# Patient Record
Sex: Female | Born: 1975 | Race: White | Hispanic: No | Marital: Single | Smoking: Never smoker
Health system: Southern US, Community
[De-identification: ages and names within clinical notes are randomized; demographics above are authoritative.]

## PROBLEM LIST (undated history)

## (undated) DIAGNOSIS — J45909 Unspecified asthma, uncomplicated: Secondary | ICD-10-CM

## (undated) DIAGNOSIS — G43909 Migraine, unspecified, not intractable, without status migrainosus: Secondary | ICD-10-CM

## (undated) HISTORY — PX: CHOLECYSTECTOMY: SHX55

---

## 2019-09-25 ENCOUNTER — Emergency Department
Admission: EM | Admit: 2019-09-25 | Discharge: 2019-09-25 | Disposition: A | Discharge status: Home / Self Care | Payer: Self-pay | Attending: Emergency Medicine | Admitting: Emergency Medicine

## 2019-09-25 ENCOUNTER — Other Ambulatory Visit: Payer: Self-pay

## 2019-09-25 DIAGNOSIS — T7840XA Allergy, unspecified, initial encounter: Secondary | ICD-10-CM | POA: Insufficient documentation

## 2019-09-25 DIAGNOSIS — J45909 Unspecified asthma, uncomplicated: Secondary | ICD-10-CM | POA: Insufficient documentation

## 2019-09-25 HISTORY — DX: Unspecified asthma, uncomplicated: J45.909

## 2019-09-25 MED ORDER — DIPHENHYDRAMINE HCL 25 MG PO TABS: 50.0000 mg | ORAL_TABLET | Freq: Four times a day (QID) | ORAL | 0 refills | Status: DC

## 2019-09-25 MED ORDER — EPINEPHRINE 0.3 MG/0.3ML IJ SOAJ: 0.3000 mg | Freq: Once | INTRAMUSCULAR | 0 refills | Status: AC

## 2019-09-25 MED ORDER — PREDNISONE 20 MG PO TABS: 40.0000 mg | ORAL_TABLET | Freq: Every day | ORAL | 0 refills | Status: AC

## 2019-09-25 MED ORDER — PREDNISONE 20 MG PO TABS
60.0000 mg | ORAL_TABLET | Freq: Once | ORAL | Status: AC
  Administered 2019-09-25: 60 mg via ORAL
  Filled 2019-09-25: qty 3

## 2019-09-25 MED ORDER — FAMOTIDINE 20 MG PO TABS
40.0000 mg | ORAL_TABLET | Freq: Once | ORAL | Status: AC
  Administered 2019-09-25: 40 mg via ORAL
  Filled 2019-09-25: qty 2

## 2019-09-25 NOTE — ED Provider Notes (Signed)
[facility] Emergency Department Provider Note  ____________________________________________  Time seen: Approximately 6:20 PM  I have reviewed the triage vital signs and the nursing notes.   HISTORY  Chief Complaint Allergic Reaction    HPI Susan Charles is a 44 y.o. female with a past medical history of asthma who was in her usual state of health when she felt a bee sting on her right leg at about 1:00 PM today.  At around 3:00 or 4:00 she started to have feeling of tightness in the throat and chest tightness and shortness of breath.  Denies rash dizziness syncope abdominal cramping or vomiting.  She took 50 mg of oral Benadryl, 1 child EpiPen, and 1 adult EpiPen at home.  She still has a sensation of shortness of breath and throat tightness so she comes to the ED.  Denies known allergy to bee stings.      Past Medical History:  Diagnosis Date  . Asthma      There are no problems to display for this patient.    Past Surgical History:  Procedure Laterality Date  . CESAREAN SECTION     x7     Prior to Admission medications   Medication Sig Start Date End Date Taking? Authorizing Provider  diphenhydrAMINE (BENADRYL) 25 MG tablet Take 2 tablets (50 mg total) by mouth every 6 (six) hours for 2 days. 09/25/19 09/27/19  Sharman Cheek, MD  EPINEPHrine 0.3 mg/0.3 mL IJ SOAJ injection Inject 0.3 mLs (0.3 mg total) into the muscle once for 1 dose. Follow package instructions as needed for severe allergy or anaphylactic reaction. 09/25/19 09/25/19  Sharman Cheek, MD  predniSONE (DELTASONE) 20 MG tablet Take 2 tablets (40 mg total) by mouth daily for 3 days. 09/25/19 09/28/19  Sharman Cheek, MD     Allergies Patient has no known allergies.   No family history on file.  Social History Social History   Tobacco Use  . Smoking status: Never Smoker  . Smokeless tobacco: Never Used  Substance Use Topics  . Alcohol use: Not Currently  . Drug use:  Not Currently    Review of Systems  Constitutional:   No fever or chills.  ENT:   No sore throat. No rhinorrhea.  Positive throat tightness Cardiovascular:   No chest pain or syncope. Respiratory:   Positive shortness of breath cough. Gastrointestinal:   Negative for abdominal pain, vomiting and diarrhea.  Musculoskeletal:   Negative for focal pain or swelling All other systems reviewed and are negative except as documented above in ROS and HPI.  ____________________________________________   PHYSICAL EXAM:  VITAL SIGNS: ED Triage Vitals  Enc Vitals Group     BP 09/25/19 1746 (!) 144/72     Pulse Rate 09/25/19 1746 79     Resp 09/25/19 1746 15     Temp 09/25/19 1746 98 F (36.7 C)     Temp Source 09/25/19 1746 Oral     SpO2 09/25/19 1746 100 %     Weight 09/25/19 1758 200 lb (90.7 kg)     Height 09/25/19 1758 5\' 4"  (1.626 m)     Head Circumference --      Peak Flow --      Pain Score 09/25/19 1758 0     Pain Loc --      Pain Edu? --      Excl. in GC? --     Vital signs reviewed, nursing assessments reviewed.   Constitutional:   Alert and oriented.  Non-toxic appearance. Eyes:   Conjunctivae are normal. EOMI. PERRL. ENT      Head:   Normocephalic and atraumatic.      Nose:   Wearing a mask.      Mouth/Throat:   Wearing a mask.      Neck:   No meningismus. Full ROM. Hematological/Lymphatic/Immunilogical:   No cervical lymphadenopathy. Cardiovascular:   RRR. Symmetric bilateral radial and DP pulses.  No murmurs. Cap refill less than 2 seconds. Respiratory:   Normal respiratory effort without tachypnea/retractions. Breath sounds are clear and equal bilaterally. No wheezes/rales/rhonchi.  No inducible wheezing or cough with FEV1 maneuver Gastrointestinal:   Soft and nontender. Non distended. There is no CVA tenderness.  No rebound, rigidity, or guarding.  Musculoskeletal:   Normal range of motion in all extremities. No joint effusions.  No lower extremity tenderness.   No edema. Neurologic:   Normal speech and language.  Motor grossly intact. No acute focal neurologic deficits are appreciated.  Skin:    Skin is warm, dry and intact. No rash noted.  No petechiae, purpura, or bullae.  ____________________________________________    LABS (pertinent positives/negatives) (all labs ordered are listed, but only abnormal results are displayed) Labs Reviewed - No data to display ____________________________________________   EKG    ____________________________________________    RADIOLOGY  No results found.  ____________________________________________   PROCEDURES Procedures  ____________________________________________    CLINICAL IMPRESSION / ASSESSMENT AND PLAN / ED COURSE  Medications ordered in the ED: Medications  predniSONE (DELTASONE) tablet 60 mg (60 mg Oral Given 09/25/19 1759)  famotidine (PEPCID) tablet 40 mg (40 mg Oral Given 09/25/19 1759)    Pertinent labs & imaging results that were available during my care of the patient were reviewed by me and considered in my medical decision making (see chart for details).  Susan Charles was evaluated in Emergency Department on 09/25/2019 for the symptoms described in the history of present illness. She was evaluated in the context of the global COVID-19 pandemic, which necessitated consideration that the patient might be at risk for infection with the SARS-CoV-2 virus that causes COVID-19. Institutional protocols and algorithms that pertain to the evaluation of patients at risk for COVID-19 are in a state of rapid change based on information released by regulatory bodies including the CDC and federal and state organizations. These policies and algorithms were followed during the patient's care in the ED.     Clinical Course as of Sep 24 1925  Tue Sep 25, 2019  1753 Presents with allergic reaction to a bee sting that happened at about noon, with symptom onset at about 3:00 PM.  Has  already taken EpiPen and 50 mg of Benadryl.  I will give her prednisone and Pepcid, observe in ED.   [PS]  1924 Patient feeling much better.  Will prescribe prednisone Benadryl and EpiPen and discharged home.  She stable for outpatient follow-up.  No clear signs of anaphylaxis on evaluation today.   [PS]    Clinical Course User Index [PS] Sharman Cheek, MD     ____________________________________________   FINAL CLINICAL IMPRESSION(S) / ED DIAGNOSES    Final diagnoses:  Allergic reaction, initial encounter     ED Discharge Orders         Ordered    predniSONE (DELTASONE) 20 MG tablet  Daily     Discontinue  Reprint     09/25/19 1926    diphenhydrAMINE (BENADRYL) 25 MG tablet  Every 6 hours     Discontinue  Reprint     09/25/19 1926    EPINEPHrine 0.3 mg/0.3 mL IJ SOAJ injection   Once     Discontinue  Reprint     09/25/19 1926          Portions of this note were generated with dragon dictation software. Dictation errors may occur despite best attempts at proofreading.   Sharman Cheek, MD 09/25/19 [phone]

## 2019-09-25 NOTE — ED Notes (Signed)
Pt reports being stung by an insect at 1330 today on the right leg.  At 1700 pt took epi pen x 2 because throat felt tight and chest felt tight.  Pt placed on monitor.  nsr on monitor.  md at bedside and meds given.  Pt alert.  Skin warm and dry.  No rash.  No diff breathing.  Pt alert. Pt in hallway bed.

## 2019-09-25 NOTE — ED Triage Notes (Signed)
Pt states something stung her today around 1pm, states she has taken 2 of her daughter EPI pens, 2 benadryl, has used her albuterol inhaler about 10x. States she feels like her throat is swelling and having difficulty swallowing. Denies not have any known bee allergy

## 2020-06-19 ENCOUNTER — Other Ambulatory Visit: Payer: Self-pay

## 2020-06-19 ENCOUNTER — Ambulatory Visit
Admission: EM | Admit: 2020-06-19 | Discharge: 2020-06-19 | Disposition: A | Discharge status: Home / Self Care | Payer: BC Managed Care – PPO | Attending: Family Medicine | Admitting: Family Medicine

## 2020-06-19 DIAGNOSIS — R3915 Urgency of urination: Secondary | ICD-10-CM | POA: Diagnosis present

## 2020-06-19 HISTORY — DX: Migraine, unspecified, not intractable, without status migrainosus: G43.909

## 2020-06-19 LAB — POCT URINALYSIS DIP (MANUAL ENTRY)
Bilirubin, UA: NEGATIVE
Glucose, UA: 100 mg/dL — AB
Leukocytes, UA: NEGATIVE
Nitrite, UA: POSITIVE — AB
Spec Grav, UA: >=1.03 — AB (ref 1.010–1.025)
Urobilinogen, UA: 1 E.U./dL
pH, UA: 5.5 (ref 5.0–8.0)

## 2020-06-19 MED ORDER — CEPHALEXIN 500 MG PO CAPS
500.0000 mg | ORAL_CAPSULE | Freq: Two times a day (BID) | ORAL | 0 refills | Status: AC
Start: 2020-06-19 — End: 2020-06-24

## 2020-06-19 MED ORDER — FLUCONAZOLE 150 MG PO TABS
150.0000 mg | ORAL_TABLET | Freq: Every day | ORAL | 0 refills | Status: DC
Start: 2020-06-19 — End: 2020-10-27

## 2020-06-19 MED ORDER — SUMATRIPTAN SUCCINATE 25 MG PO TABS: 25.0000 mg | ORAL_TABLET | ORAL | 0 refills | Status: DC | PRN

## 2020-06-19 MED ORDER — NYSTATIN-TRIAMCINOLONE 100000-0.1 UNIT/GM-% EX CREA
TOPICAL_CREAM | CUTANEOUS | 0 refills | Status: DC
Start: 2020-06-19 — End: 2021-06-11

## 2020-06-19 NOTE — Discharge Instructions (Addendum)
Treating you for a urinary tract infection Increase fluids, AZO as needed  Refilled your Imitrex

## 2020-06-19 NOTE — ED Triage Notes (Signed)
Pt reports having lower back pain, urinary urgency, and burning x2 weeks. Pt has been using AZO. Also would like a refill of imitrex.

## 2020-06-20 NOTE — ED Provider Notes (Signed)
Susan Charles    CSN: [id number] Arrival date & time: 06/19/20  1540      History   Chief Complaint Chief Complaint  Patient presents with  . Back Pain    HPI Susan Charles is a 45 y.o. female.   Patient is a 46 year old female presents today with complaints of dysuria, urinary frequency, urgency, lower back pain for the past 2 weeks.  Has been using AZO.  History of recurrent UTIs.  No fever, chills, flank pain, nausea or vomiting.  Has been using AZO. Patient also requesting refill on her Imitrex.   Back Pain   Past Medical History:  Diagnosis Date  . Asthma   . Migraine     There are no problems to display for this patient.   Past Surgical History:  Procedure Laterality Date  . CESAREAN SECTION     x7  . CHOLECYSTECTOMY      OB History   No obstetric history on file.      Home Medications    Prior to Admission medications   Medication Sig Start Date End Date Taking? Authorizing Provider  cephALEXin (KEFLEX) 500 MG capsule Take 1 capsule (500 mg total) by mouth 2 (two) times daily for 5 days. 06/19/20 06/24/20 Yes Lynessa Almanzar A, NP  fluconazole (DIFLUCAN) 150 MG tablet Take 1 tablet (150 mg total) by mouth daily. 06/19/20  Yes Marcos Peloso A, NP  nystatin-triamcinolone (MYCOLOG II) cream Apply to affected area daily 06/19/20  Yes Kahdijah Errickson A, NP  omeprazole (PRILOSEC) 20 MG capsule Take 20 mg by mouth daily.   Yes [provider]  diphenhydrAMINE (BENADRYL) 25 MG tablet Take 2 tablets (50 mg total) by mouth every 6 (six) hours for 2 days. 09/25/19 09/27/19  Sharman Cheek, MD  phentermine (ADIPEX-P) 37.5 MG tablet Take 37.5 mg by mouth daily. 06/17/20   [provider]  SUMAtriptan (IMITREX) 25 MG tablet Take 1 tablet (25 mg total) by mouth every 2 (two) hours as needed for migraine. May repeat in 2 hours if headache persists or recurs. 06/19/20   Janace Aris, NP    Family History No family history on file.  Social  History Social History   Tobacco Use  . Smoking status: Never Smoker  . Smokeless tobacco: Never Used  Substance Use Topics  . Alcohol use: Not Currently  . Drug use: Not Currently     Allergies   Patient has no known allergies.   Review of Systems Review of Systems  Musculoskeletal: Positive for back pain.     Physical Exam Triage Vital Signs ED Triage Vitals  Enc Vitals Group     BP 06/19/20 1600 124/76     Pulse Rate 06/19/20 1600 76     Resp 06/19/20 1600 16     Temp 06/19/20 1600 98.6 F (37 C)     Temp Source 06/19/20 1600 Oral     SpO2 06/19/20 1600 96 %     Weight 06/19/20 1601 220 lb (99.8 kg)     Height 06/19/20 1601 5\' 4"  (1.626 m)     Head Circumference --      Peak Flow --      Pain Score 06/19/20 1601 5     Pain Loc --      Pain Edu? --      Excl. in GC? --    No data found.  Updated Vital Signs BP 124/76 (BP Location: Left Arm)   Pulse 76   Temp  98.6 F (37 C) (Oral)   Resp 16   Ht 5\' 4"  (1.626 m)   Wt 220 lb (99.8 kg)   LMP 06/16/2020   SpO2 96%   BMI 37.76 kg/m   Visual Acuity Right Eye Distance:   Left Eye Distance:   Bilateral Distance:    Right Eye Near:   Left Eye Near:    Bilateral Near:     Physical Exam Vitals and nursing note reviewed.  Constitutional:      General: She is not in acute distress.    Appearance: Normal appearance. She is not ill-appearing, toxic-appearing or diaphoretic.  HENT:     Head: Normocephalic.  Eyes:     Conjunctiva/sclera: Conjunctivae normal.  Pulmonary:     Effort: Pulmonary effort is normal.  Musculoskeletal:        General: Normal range of motion.     Cervical back: Normal range of motion.  Skin:    General: Skin is warm and dry.     Findings: No rash.  Neurological:     Mental Status: She is alert.  Psychiatric:        Mood and Affect: Mood normal.      UC Treatments / Results  Labs (all labs ordered are listed, but only abnormal results are displayed) Labs Reviewed   POCT URINALYSIS DIP (MANUAL ENTRY) - Abnormal; Notable for the following components:      Result Value   Color, UA orange (*)    Clarity, UA hazy (*)    Glucose, UA =100 (*)    Ketones, POC UA small (15) (*)    Spec Grav, UA >=1.030 (*)    Blood, UA large (*)    Protein Ur, POC trace (*)    Nitrite, UA Positive (*)    All other components within normal limits  URINE CULTURE    EKG   Radiology No results found.  Procedures Procedures (including critical care time)  Medications Ordered in UC Medications - No data to display  Initial Impression / Assessment and Plan / UC Course  I have reviewed the triage vital signs and the nursing notes.  Pertinent labs & imaging results that were available during my care of the patient were reviewed by me and considered in my medical decision making (see chart for details).     Urinary frequency Urinalysis consistent with UTI today.  Sending for culture.  We will go ahead and treat with Keflex at this time.  Continue AZO as needed.  Prescribed Diflucan and mycolog for antibiotic-induced yeast infection Recommended push fluids.  Refilled Imitrex as requested Final Clinical Impressions(s) / UC Diagnoses   Final diagnoses:  Urinary urgency     Discharge Instructions     Treating you for a urinary tract infection Increase fluids, AZO as needed  Refilled your Imitrex     ED Prescriptions    Medication Sig Dispense Auth. Provider   cephALEXin (KEFLEX) 500 MG capsule Take 1 capsule (500 mg total) by mouth 2 (two) times daily for 5 days. 10 capsule Samuele Storey A, NP   SUMAtriptan (IMITREX) 25 MG tablet Take 1 tablet (25 mg total) by mouth every 2 (two) hours as needed for migraine. May repeat in 2 hours if headache persists or recurs. 10 tablet Nicholl Onstott A, NP   fluconazole (DIFLUCAN) 150 MG tablet Take 1 tablet (150 mg total) by mouth daily. 2 tablet Dinita Migliaccio A, NP   nystatin-triamcinolone (MYCOLOG II) cream Apply to affected  area daily  15 g Dahlia Byes A, NP     PDMP not reviewed this encounter.   Janace Aris, NP 06/20/20 [phone]

## 2020-06-21 LAB — URINE CULTURE: Culture: NO GROWTH

## 2020-06-24 ENCOUNTER — Ambulatory Visit (INDEPENDENT_AMBULATORY_CARE_PROVIDER_SITE_OTHER): Payer: BC Managed Care – PPO | Admitting: Obstetrics

## 2020-06-24 ENCOUNTER — Other Ambulatory Visit: Payer: Self-pay

## 2020-06-24 ENCOUNTER — Other Ambulatory Visit (HOSPITAL_COMMUNITY)
Admission: RE | Admit: 2020-06-24 | Discharge: 2020-06-24 | Disposition: A | Discharge status: Home / Self Care | Payer: BC Managed Care – PPO | Source: Ambulatory Visit | Attending: Obstetrics | Admitting: Obstetrics

## 2020-06-24 ENCOUNTER — Encounter: Payer: Self-pay | Admitting: Obstetrics

## 2020-06-24 VITALS — BP 128/78 | Ht 64.0 in | Wt 220.0 lb

## 2020-06-24 DIAGNOSIS — Z1231 Encounter for screening mammogram for malignant neoplasm of breast: Secondary | ICD-10-CM

## 2020-06-24 DIAGNOSIS — Z124 Encounter for screening for malignant neoplasm of cervix: Secondary | ICD-10-CM | POA: Diagnosis not present

## 2020-06-24 DIAGNOSIS — R350 Frequency of micturition: Secondary | ICD-10-CM

## 2020-06-24 DIAGNOSIS — Z01419 Encounter for gynecological examination (general) (routine) without abnormal findings: Secondary | ICD-10-CM | POA: Insufficient documentation

## 2020-06-24 DIAGNOSIS — R300 Dysuria: Secondary | ICD-10-CM

## 2020-06-24 MED ORDER — SULFAMETHOXAZOLE-TRIMETHOPRIM 800-160 MG PO TABS: 1.0000 | ORAL_TABLET | Freq: Two times a day (BID) | ORAL | 1 refills | Status: DC

## 2020-06-24 NOTE — Progress Notes (Signed)
Gynecology Annual Exam  PCP: Patient, No Pcp Per (Inactive)  Chief Complaint:  Chief Complaint  Patient presents with  . Gynecologic Exam    Annual - no concerns. RM 4    History of Present Illness: Patient is a 45 y.o. Susan Charles presents for annual exam. The patient has no complaints today.  Susan Charles is new to [employer]. She works in the [employer], but is now studying to work in [employer]. She has 9 children, and has had 6 CSs, and 3 SVDs. She denies any history of abnormal pap smears. She has never had a mammogram. She does share that she was recently treated for a UTI after going to an Urgent Care. She started on Keflex, but stoped taking this, due to GI distress.She would like to try Bactrim. LMP: Patient's last menstrual period was 06/16/2020. Average Interval: regular, 28 days Duration of flow: 5 days Heavy Menses: no Clots: no Intermenstrual Bleeding: no Postcoital Bleeding: no Dysmenorrhea: no   The patient is sexually active. She currently uses vasectomy for contraception. She admits to dyspareunia.  The patient does perform self breast exams.  There is no notable family history of breast or ovarian cancer in her family.  The patient wears seatbelts: yes.   The patient has regular exercise: no.    The patient denies current symptoms of depression.    Review of Systems: ROS  Past Medical History:  There are no problems to display for this patient.   Past Surgical History:  Past Surgical History:  Procedure Laterality Date  . CESAREAN SECTION     x7  . CHOLECYSTECTOMY      Gynecologic History:  Patient's last menstrual period was 06/16/2020. Contraception: vasectomy Last Pap: Results were: NILM no abnormalities  Last mammogram: never had a mammo   Obstetric History: [id number]  Family History:  Family History  Problem Relation Age of Onset  . Cancer Father     Social History:  Social History   Socioeconomic History  . Marital  status: Single    Spouse name: Not on file  . Number of children: Not on file  . Years of education: Not on file  . Highest education level: Not on file  Occupational History  . Not on file  Tobacco Use  . Smoking status: Never Smoker  . Smokeless tobacco: Never Used  Vaping Use  . Vaping Use: Never used  Substance and Sexual Activity  . Alcohol use: Never  . Drug use: Never  . Sexual activity: Yes    Birth control/protection: Surgical    Comment: Vasectomy  Other Topics Concern  . Not on file  Social History Narrative  . Not on file   Social Determinants of Health   Financial Resource Strain: Not on file  Food Insecurity: Not on file  Transportation Needs: Not on file  Physical Activity: Not on file  Stress: Not on file  Social Connections: Not on file  Intimate Partner Violence: Not on file    Allergies:  No Known Allergies  Medications: Prior to Admission medications   Medication Sig Start Date End Date Taking? Authorizing Provider  cephALEXin (KEFLEX) 500 MG capsule Take 1 capsule (500 mg total) by mouth 2 (two) times daily for 5 days. 06/19/20 06/24/20 Yes Bast, Traci A, NP  fluconazole (DIFLUCAN) 150 MG tablet Take 1 tablet (150 mg total) by mouth daily. 06/19/20  Yes Bast, Traci A, NP  omeprazole (PRILOSEC) 20 MG capsule Take 20 mg by  mouth daily.   Yes [provider]  phentermine (ADIPEX-P) 37.5 MG tablet Take 37.5 mg by mouth daily. 06/17/20  Yes [provider]  sulfamethoxazole-trimethoprim (BACTRIM DS) 800-160 MG tablet Take 1 tablet by mouth 2 (two) times daily. 06/24/20  Yes Mirna Mires, CNM  SUMAtriptan (IMITREX) 25 MG tablet Take 1 tablet (25 mg total) by mouth every 2 (two) hours as needed for migraine. May repeat in 2 hours if headache persists or recurs. 06/19/20  Yes Bast, Traci A, NP  diphenhydrAMINE (BENADRYL) 25 MG tablet Take 2 tablets (50 mg total) by mouth every 6 (six) hours for 2 days. 09/25/19 09/27/19  Sharman Cheek, MD   nystatin-triamcinolone [facility] II) cream Apply to affected area daily 06/19/20   Janace Aris, NP    Physical Exam Vitals: Blood pressure 128/78, height 5\' 4"  (1.626 m), weight 220 lb (99.8 kg), last menstrual period 06/16/2020.  General: NAD HEENT: normocephalic, anicteric Thyroid: no enlargement, no palpable nodules Pulmonary: No increased work of breathing, CTAB Cardiovascular: RRR, distal pulses 2+ Breast: Breast symmetrical, no tenderness, small 52mm palpable hard, smooth edged mass noted at the upper margin of her right breast at approximately  1 PM , no skin or nipple retraction present, no nipple discharge.  No axillary or supraclavicular lymphadenopathy. Abdomen: NABS, soft, non-tender, non-distended.  Umbilicus without lesions.  No hepatomegaly, splenomegaly or masses palpable. No evidence of hernia  Genitourinary:  External: Normal external female genitalia.  Normal urethral meatus, normal Bartholin's and Skene's glands.    Vagina: Normal vaginal mucosa, no evidence of prolapse.    Cervix: Grossly normal in appearance, no bleeding  Uterus: Non-enlarged, mobile, normal contour.  No CMT  Adnexa: ovaries non-enlarged, no adnexal masses  Rectal: deferred  Lymphatic: no evidence of inguinal lymphadenopathy Extremities: no edema, erythema, or tenderness Neurologic: Grossly intact Psychiatric: mood appropriate, affect full  Female chaperone present for pelvic and breast  portions of the physical exam    Assessment: 45 y.o. 59 routine annual exam  Plan: Problem List Items Addressed This Visit   None   Visit Diagnoses    Women's annual routine gynecological examination    -  Primary   Relevant Orders   Cytology - PAP   Cervical cancer screening       Frequency of urination       Relevant Medications   sulfamethoxazole-trimethoprim (BACTRIM DS) 800-160 MG tablet   Dysuria       Relevant Medications   sulfamethoxazole-trimethoprim (BACTRIM DS) 800-160 MG tablet       1) Mammogram - recommend yearly screening mammogram.  Mammogram Was ordered today  Likely sebaceous cyst palpated on her breast exam today.   2) STI screening  wasoffered and declined  3) ASCCP guidelines and rational discussed.  Patient opts for every 3 years screening interval  4) Contraception - the patient is currently using  vasectomy.  She is happy with her current form of contraception and plans to continue  5) Colonoscopy -- Screening recommended starting at age 10 for average risk individuals, age 49 for individuals deemed at increased risk (including African Americans) and recommended to continue until age 70.  For patient age 62-85 individualized approach is recommended.  Gold standard screening is via colonoscopy, Cologuard screening is an acceptable alternative for patient unwilling or unable to undergo colonoscopy.  "Colorectal cancer screening for average?risk adults: 2018 guideline update from the American Cancer Society"[place]: A Cancer Journal for Clinicians: Aug 25, 2016   6) Routine healthcare maintenance  including cholesterol, diabetes screening discussed managed by PCP  7) Return in about 1 year (around 06/24/2021) for annual.  8) Per her request, will Rx limited Bactrim to address her UTI symptoms. She started a RX for Keflex, but had GI side effets that were debilitating, and she stopped taking the medication after several days. As she has used Bactrim in the past, will send in a Rx for this. 06/24/2020, 4:26 PM

## 2020-06-26 LAB — CYTOLOGY - PAP
Comment: NEGATIVE
Diagnosis: NEGATIVE
High risk HPV: POSITIVE — AB

## 2020-06-30 NOTE — Telephone Encounter (Signed)
Can wait for Susan Charles

## 2020-07-10 ENCOUNTER — Inpatient Hospital Stay: Admission: RE | Admit: 2020-07-10 | Payer: BC Managed Care – PPO | Source: Ambulatory Visit

## 2020-08-18 ENCOUNTER — Ambulatory Visit: Payer: Self-pay | Admitting: Adult Health

## 2020-08-18 NOTE — Progress Notes (Deleted)
   New Patient Office Visit  Subjective:  Patient ID: Susan Charles, female    DOB: 1975/04/05  Age: 45 y.o. MRN: [id number]  CC: No chief complaint on file.   HPI Susan Charles presents for ***  Past Medical History:  Diagnosis Date  . Asthma   . Migraine     Past Surgical History:  Procedure Laterality Date  . CESAREAN SECTION     x7  . CHOLECYSTECTOMY      Family History  Problem Relation Age of Onset  . Cancer Father     Social History   Socioeconomic History  . Marital status: Single    Spouse name: Not on file  . Number of children: Not on file  . Years of education: Not on file  . Highest education level: Not on file  Occupational History  . Not on file  Tobacco Use  . Smoking status: Never Smoker  . Smokeless tobacco: Never Used  Vaping Use  . Vaping Use: Never used  Substance and Sexual Activity  . Alcohol use: Never  . Drug use: Never  . Sexual activity: Yes    Birth control/protection: Surgical    Comment: Vasectomy  Other Topics Concern  . Not on file  Social History Narrative  . Not on file   Social Determinants of Health   Financial Resource Strain: Not on file  Food Insecurity: Not on file  Transportation Needs: Not on file  Physical Activity: Not on file  Stress: Not on file  Social Connections: Not on file  Intimate Partner Violence: Not on file    ROS Review of Systems  Objective:   Today's Vitals: There were no vitals taken for this visit.  Physical Exam  Assessment & Plan:   Problem List Items Addressed This Visit   None     Outpatient Encounter Medications as of 08/18/2020  Medication Sig  . diphenhydrAMINE (BENADRYL) 25 MG tablet Take 2 tablets (50 mg total) by mouth every 6 (six) hours for 2 days.  . fluconazole (DIFLUCAN) 150 MG tablet Take 1 tablet (150 mg total) by mouth daily.  Marland Kitchen nystatin-triamcinolone (MYCOLOG II) cream Apply to affected area daily  . omeprazole (PRILOSEC) 20 MG capsule Take 20 mg by  mouth daily.  . phentermine (ADIPEX-P) 37.5 MG tablet Take 37.5 mg by mouth daily.  Marland Kitchen sulfamethoxazole-trimethoprim (BACTRIM DS) 800-160 MG tablet Take 1 tablet by mouth 2 (two) times daily.  . SUMAtriptan (IMITREX) 25 MG tablet Take 1 tablet (25 mg total) by mouth every 2 (two) hours as needed for migraine. May repeat in 2 hours if headache persists or recurs.   No facility-administered encounter medications on file as of 08/18/2020.    Follow-up: No follow-ups on file.   Jairo Ben, FNP

## 2020-10-27 ENCOUNTER — Ambulatory Visit: Payer: BC Managed Care – PPO | Admitting: Obstetrics and Gynecology

## 2020-10-27 ENCOUNTER — Ambulatory Visit
Admission: EM | Admit: 2020-10-27 | Discharge: 2020-10-27 | Disposition: A | Discharge status: Home / Self Care | Payer: BC Managed Care – PPO | Attending: Emergency Medicine | Admitting: Emergency Medicine

## 2020-10-27 ENCOUNTER — Other Ambulatory Visit: Payer: Self-pay

## 2020-10-27 ENCOUNTER — Encounter: Payer: Self-pay | Admitting: Emergency Medicine

## 2020-10-27 DIAGNOSIS — N390 Urinary tract infection, site not specified: Secondary | ICD-10-CM | POA: Diagnosis present

## 2020-10-27 LAB — POCT URINALYSIS DIP (MANUAL ENTRY)
Bilirubin, UA: NEGATIVE
Blood, UA: NEGATIVE
Glucose, UA: 100 mg/dL — AB
Ketones, POC UA: NEGATIVE mg/dL
Nitrite, UA: POSITIVE — AB
Protein Ur, POC: 30 mg/dL — AB
Spec Grav, UA: 1.01 (ref 1.010–1.025)
Urobilinogen, UA: 2 E.U./dL — AB
pH, UA: 5 (ref 5.0–8.0)

## 2020-10-27 MED ORDER — PHENAZOPYRIDINE HCL 200 MG PO TABS: 200.0000 mg | ORAL_TABLET | Freq: Three times a day (TID) | ORAL | 0 refills | Status: AC

## 2020-10-27 MED ORDER — SULFAMETHOXAZOLE-TRIMETHOPRIM 800-160 MG PO TABS
1.0000 | ORAL_TABLET | Freq: Two times a day (BID) | ORAL | 0 refills | Status: AC
Start: 2020-10-27 — End: 2020-10-30

## 2020-10-27 MED ORDER — FLUCONAZOLE 150 MG PO TABS
150.0000 mg | ORAL_TABLET | Freq: Every day | ORAL | 0 refills | Status: DC
Start: 2020-10-27 — End: 2021-06-11

## 2020-10-27 NOTE — ED Provider Notes (Signed)
Subjective:    Susan Charles is a very pleasant 45 y.o. female who presents with concerns for UTI due to dysuria and lower abdominal pain for the last 3 to 4 days.  Patient also endorses some bilateral low back pain.  She states that she has burning with urination and has noticed some nausea as of yesterday.  Patient states that she has been using Azo which seems to help with her pain.  Patient reports that she has a history of UTIs and states that Bactrim seems to help. No unilateral back pain, vomiting, fever, vaginal discharge, concern for STD.  Past medical history, past surgical history, current medications reviewed.  Allergies: has No Known Allergies.  Review of Systems See HPI   Objective:     Vitals:   10/27/20 1044  BP: 106/74  Pulse: 71  Resp: 17  Temp: 99.4 F (37.4 C)  SpO2: 95%     General: Appears well-developed and well-nourished. No acute distress.  Cardiovascular: Normal rate  Pulm/Chest: No respiratory distress.  Abdominal: No CVAT.  Neurological: Alert and oriented to person, place, and time.  Skin: Skin is warm and dry.  Psychiatric: Normal mood, affect, behavior, and thought content.  GU:  Deferred secondary to self collect specimen.  Laboratory:  Orders Placed This Encounter  Procedures   Urine Culture   POCT urinalysis dipstick   Results for orders placed or performed during the hospital encounter of 10/27/20  POCT urinalysis dipstick  Result Value Ref Range   Color, UA orange (A) yellow   Clarity, UA cloudy (A) clear   Glucose, UA =100 (A) negative mg/dL   Bilirubin, UA negative negative   Ketones, POC UA negative negative mg/dL   Spec Grav, UA 5.974 1.638 - 1.025   Blood, UA negative negative   pH, UA 5.0 5.0 - 8.0   Protein Ur, POC =30 (A) negative mg/dL   Urobilinogen, UA 2.0 (A) 0.2 or 1.0 E.U./dL   Nitrite, UA Positive (A) Negative   Leukocytes, UA Large (3+) (A) Negative    -Urinalysis reveals orange cloudy urine, 100 glucose, 30  protein, 2 urobilinogen, positive nitrite and large leukocytes. -Urine culture pending Assessment:   1. Acute UTI Meds ordered this encounter  Medications   sulfamethoxazole-trimethoprim (BACTRIM DS) 800-160 MG tablet    Sig: Take 1 tablet by mouth 2 (two) times daily for 3 days.    Dispense:  6 tablet    Refill:  0    Order Specific Question:   Supervising Provider    Answer:   Merrilee Jansky [[id number]]   phenazopyridine (PYRIDIUM) 200 MG tablet    Sig: Take 1 tablet (200 mg total) by mouth 3 (three) times daily for 2 days.    Dispense:  6 tablet    Refill:  0    Order Specific Question:   Supervising Provider    Answer:   Merrilee Jansky [[id number]]   fluconazole (DIFLUCAN) 150 MG tablet    Sig: Take 1 tablet (150 mg total) by mouth daily.    Dispense:  1 tablet    Refill:  0    Order Specific Question:   Supervising Provider    Answer:   Merrilee Jansky [id number]    Plan:   MDM: Patient presents with concerns for UTI due to dysuria and lower abdominal pain for the last 3 to 4 days.  Patient also endorses some bilateral low back pain.  She states that she has burning with urination and has  noticed some nausea as of yesterday.  Patient states that she has been using Azo which seems to help with her pain.  Patient reports that she has a history of UTIs and states that Bactrim seems to help. No unilateral back pain, vomiting, fever, vaginal discharge, concern for STD.  Chart review completed.  Urinalysis reveals orange cloudy urine, 100 glucose, 30 protein, 2 urobilinogen, positive nitrite and large leukocytes.  Urine culture pending.  Given symptoms along with assessment findings, likely acute UTI.  Rx'd Bactrim twice daily x3 days and Pyridium to help with urinary discomfort while infection is being treated.  There is no recent urine culture on file that shows which bacteria the patient typically retains.  There was a urine culture with no growth which was on 06/19/2020.  Advised that  we will change antibiotic course if her urine culture permits.  Patient states that she does typically get a yeast infection after antibiotic use and is requesting a Diflucan tablet to be sent to her pharmacy.  Rx'd this medication to her pharmacy as well to use if she begins to notice vaginal itching, burning or thick white discharge.  Discussed strict return/emergency department precautions for worsening of symptoms as outlined in her AVS.  Patient verbalized understanding and agreed with plan.  Stable on discharge.  Return as needed.    Discharge Instructions      You were seen today for an infection in the lower urinary tract. Your urine sample today was sent for a culture. The urine culture will show what type of bacteria grows and if you are on the appropriate antibiotic. If the antibiotic needs to be changed, you will receive a phone call from the follow up nurse who will give you more information. If you do not receive a call, then you are on the correct antibiotic.  Take antibiotics as directed. Finish course even if feeling better sooner. Drink plenty of clear fluids. Over the counter "Uristat" or "Azo Standard" may help your discomfort.  This will turn your urine dark orange or red and may stain contacts (remove before using). You may experience 24 - 48 hours of continuing discomfort until medication controls the infection.  Return to clinic or go to the ER if you develop a fever, one-sided back pain, or vomiting as these are signs of a worsening infection.   If you begin to experience symptoms of a yeast infection such as vaginal irritation, thick white clumpy vaginal discharge, vaginal itching you may take your Diflucan tablet to treat these symptoms.         Amalia Greenhouse, FNP 10/27/20 1059

## 2020-10-27 NOTE — Discharge Instructions (Addendum)
You were seen today for an infection in the lower urinary tract. Your urine sample today was sent for a culture. The urine culture will show what type of bacteria grows and if you are on the appropriate antibiotic. If the antibiotic needs to be changed, you will receive a phone call from the follow up nurse who will give you more information. If you do not receive a call, then you are on the correct antibiotic.  Take antibiotics as directed. Finish course even if feeling better sooner. Drink plenty of clear fluids. Over the counter "Uristat" or "Azo Standard" may help your discomfort.  This will turn your urine dark orange or red and may stain contacts (remove before using). You may experience 24 - 48 hours of continuing discomfort until medication controls the infection.  Return to clinic or go to the ER if you develop a fever, one-sided back pain, or vomiting as these are signs of a worsening infection.   If you begin to experience symptoms of a yeast infection such as vaginal irritation, thick white clumpy vaginal discharge, vaginal itching you may take your Diflucan tablet to treat these symptoms.

## 2020-10-27 NOTE — ED Triage Notes (Signed)
Patient c/o dysuria and ABD pain x 3-4 days.   Patient denies N/V and fevers. Patient denies vaginal discharge.   Patient endorses bilateral lower back pain.   Patient endorses " burning " with urination.   Patient has taken AZO with no relief of symptoms.

## 2020-10-28 LAB — URINE CULTURE

## 2021-01-06 ENCOUNTER — Telehealth: Payer: Self-pay

## 2021-01-06 NOTE — Telephone Encounter (Signed)
Pt needs [facility] to put in a mammogram order, she wants to have it done in [place], her old order expired.

## 2021-01-08 ENCOUNTER — Other Ambulatory Visit: Payer: Self-pay | Admitting: Obstetrics

## 2021-01-08 DIAGNOSIS — Z1231 Encounter for screening mammogram for malignant neoplasm of breast: Secondary | ICD-10-CM

## 2021-01-08 NOTE — Telephone Encounter (Signed)
Pt aware.

## 2021-01-08 NOTE — Progress Notes (Signed)
Mammogram

## 2021-02-03 ENCOUNTER — Other Ambulatory Visit: Payer: Self-pay | Admitting: Obstetrics

## 2021-02-03 DIAGNOSIS — Z1231 Encounter for screening mammogram for malignant neoplasm of breast: Secondary | ICD-10-CM

## 2021-02-05 ENCOUNTER — Other Ambulatory Visit: Payer: Self-pay | Admitting: Obstetrics

## 2021-02-05 DIAGNOSIS — Z1231 Encounter for screening mammogram for malignant neoplasm of breast: Secondary | ICD-10-CM

## 2021-03-09 ENCOUNTER — Ambulatory Visit
Admission: RE | Admit: 2021-03-09 | Discharge: 2021-03-09 | Disposition: A | Discharge status: Home / Self Care | Payer: BC Managed Care – PPO | Source: Ambulatory Visit | Attending: Obstetrics | Admitting: Obstetrics

## 2021-03-09 ENCOUNTER — Other Ambulatory Visit: Payer: Self-pay

## 2021-03-09 DIAGNOSIS — Z1231 Encounter for screening mammogram for malignant neoplasm of breast: Secondary | ICD-10-CM | POA: Insufficient documentation

## 2021-06-11 ENCOUNTER — Ambulatory Visit (INDEPENDENT_AMBULATORY_CARE_PROVIDER_SITE_OTHER): Payer: BC Managed Care – PPO | Admitting: Nurse Practitioner

## 2021-06-11 ENCOUNTER — Other Ambulatory Visit: Payer: Self-pay

## 2021-06-11 ENCOUNTER — Encounter: Payer: Self-pay | Admitting: Nurse Practitioner

## 2021-06-11 VITALS — BP 130/84 | HR 70 | Ht 65.0 in | Wt 201.8 lb

## 2021-06-11 DIAGNOSIS — E559 Vitamin D deficiency, unspecified: Secondary | ICD-10-CM | POA: Diagnosis not present

## 2021-06-11 DIAGNOSIS — N390 Urinary tract infection, site not specified: Secondary | ICD-10-CM | POA: Diagnosis not present

## 2021-06-11 DIAGNOSIS — Z7689 Persons encountering health services in other specified circumstances: Secondary | ICD-10-CM | POA: Insufficient documentation

## 2021-06-11 LAB — POCT URINALYSIS DIPSTICK
Bilirubin, UA: NEGATIVE
Blood, UA: NEGATIVE
Glucose, UA: NEGATIVE
Ketones, UA: NEGATIVE
Leukocytes, UA: NEGATIVE
Nitrite, UA: POSITIVE
Protein, UA: NEGATIVE
Spec Grav, UA: 1.01 (ref 1.010–1.025)
Urobilinogen, UA: 1 E.U./dL
pH, UA: 6.5 (ref 5.0–8.0)

## 2021-06-11 MED ORDER — FLUCONAZOLE 50 MG PO TABS: 50.0000 mg | ORAL_TABLET | Freq: Every day | ORAL | 0 refills | Status: DC

## 2021-06-11 MED ORDER — SULFAMETHOXAZOLE-TRIMETHOPRIM 800-160 MG PO TABS: 1.0000 | ORAL_TABLET | Freq: Two times a day (BID) | ORAL | 0 refills | Status: DC

## 2021-06-11 NOTE — Assessment & Plan Note (Signed)
Suprapubic tenderness, urinary frequency and dysuria.  ?Sample positive for nitrites. ?Started pt on bactrim and diflucan  ?

## 2021-06-11 NOTE — Assessment & Plan Note (Signed)
Screening labs ordered

## 2021-06-11 NOTE — Progress Notes (Signed)
? ?New Patient Office Visit ? ?Subjective:  ?Patient ID: Susan Charles, female    DOB: 1975/07/18  Age: 46 y.o. MRN: [id number] ? ?CC:  ?Chief Complaint  ?Patient presents with  ? New Patient (Initial Visit)  ? Urinary Tract Infection  ?  Patient has been having urinary frequency, painful urination, and back pain for several weeks.   ? ? ?HPI ?Susan Charles presents for establishing care. Patient moved in the area from [place] about 2 years ago. 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   ?Patient reports urinary frequency, dysuria and pelvic pain for several weeks. She is taking OTC Azo medication with some relief.  ? ?Past Medical History:  ?Diagnosis Date  ? Asthma   ? Migraine   ? ? ?Past Surgical History:  ?Procedure Laterality Date  ? CESAREAN SECTION    ? x7  ? CHOLECYSTECTOMY    ? ? ?Family History  ?Problem Relation Age of Onset  ? Cancer Father   ? Breast cancer Neg Hx   ? ? ?Social History  ? ?Socioeconomic History  ? Marital status: Single  ?  Spouse name: Not on file  ? Number of children: Not on file  ? Years of education: Not on file  ? Highest education level: Not on file  ?Occupational History  ? Not on file  ?Tobacco Use  ? Smoking status: Never  ? Smokeless tobacco: Never  ?Vaping Use  ? Vaping Use: Never used  ?Substance and Sexual Activity  ? Alcohol use: Never  ? Drug use: Never  ? Sexual activity: Yes  ?  Birth control/protection: Surgical  ?  Comment: Vasectomy  ?Other Topics Concern  ? Not on file  ?Social History Narrative  ? Not on file  ? ?Social Determinants of Health  ? ?Financial Resource Strain: Not on file  ?Food Insecurity: Not on file  ?Transportation Needs: Not on file  ?Physical Activity: Not on file  ?Stress: Not on file  ?Social Connections: Not on file  ?Intimate Partner Violence: Not on file  ? ? ?ROS ?Review of Systems  ?Constitutional:  Negative for activity change and appetite change.  ?HENT: Negative.    ?Eyes: Negative.   ?Respiratory:  Negative  for cough, chest tightness, shortness of breath and wheezing.   ?Cardiovascular:  Negative for chest pain and palpitations.  ?Gastrointestinal:  Negative for abdominal pain and constipation.  ?Genitourinary:  Positive for dysuria, frequency and pelvic pain. Negative for hematuria.  ?Musculoskeletal: Negative.   ?Skin: Negative.   ?Neurological:  Negative for dizziness, numbness and headaches.  ?Psychiatric/Behavioral:  Negative for agitation, behavioral problems and confusion.   ? ?Objective:  ? ?Today's Vitals: BP 130/84   Pulse 70   Ht 5\' 5"  (1.651 m)   Wt 201 lb 12.8 oz (91.5 kg)   BMI 33.58 kg/m?  ? ?Physical Exam ?Constitutional:   ?   Appearance: Normal appearance. She is obese.  ?HENT:  ?   Head: Normocephalic.  ?   Right Ear: Tympanic membrane normal.  ?   Left Ear: Tympanic membrane normal.  ?   Nose: Nose normal.  ?   Mouth/Throat:  ?   Mouth: Mucous membranes are moist.  ?Eyes:  ?   Pupils: Pupils are equal, round, and reactive to light.  ?Cardiovascular:  ?   Rate and Rhythm: Normal rate and regular rhythm.  ?   Pulses: Normal pulses.  ?   Heart sounds: Normal heart sounds.  ?Pulmonary:  ?  Effort: Pulmonary effort is normal.  ?   Breath sounds: Normal breath sounds.  ?Abdominal:  ?   General: Bowel sounds are normal.  ?   Palpations: Abdomen is soft.  ?   Tenderness: There is abdominal tenderness in the suprapubic area. There is no right CVA tenderness or left CVA tenderness.  ?Musculoskeletal:     ?   General: Normal range of motion.  ?   Cervical back: Normal range of motion and neck supple.  ?Skin: ?   General: Skin is warm.  ?   Capillary Refill: Capillary refill takes less than 2 seconds.  ?Neurological:  ?   General: No focal deficit present.  ?   Mental Status: She is alert and oriented to person, place, and time. Mental status is at baseline.  ?Psychiatric:     ?   Mood and Affect: Mood normal.     ?   Behavior: Behavior normal.     ?   Thought Content: Thought content normal.     ?    Judgment: Judgment normal.  ? ? ?Assessment & Plan:  ? ?Problem List Items Addressed This Visit   ? ?  ? Genitourinary  ? Urinary tract infection without hematuria  ?  Suprapubic tenderness, urinary frequency and dysuria.  ?Sample positive for nitrites. ?Started pt on bactrim and diflucan  ?  ?  ? Relevant Medications  ? sulfamethoxazole-trimethoprim (BACTRIM DS) 800-160 MG tablet  ? fluconazole (DIFLUCAN) 50 MG tablet  ? Other Relevant Orders  ? POCT urinalysis dipstick (Completed)  ?  ? Other  ? Establishing care with new doctor, encounter for - Primary  ?  Screening labs ordered.  ?  ?  ? Relevant Orders  ? Basic metabolic panel  ? COMPLETE METABOLIC PANEL WITH GFR  ? Lipid panel  ? Vitamin D deficiency  ? Relevant Orders  ? Vitamin D 1,25 dihydroxy  ? ? ?Outpatient Encounter Medications as of 06/11/2021  ?Medication Sig  ? albuterol (VENTOLIN HFA) 108 (90 Base) MCG/ACT inhaler Inhale into the lungs every 6 (six) hours as needed for wheezing or shortness of breath.  ? fluconazole (DIFLUCAN) 50 MG tablet Take 1 tablet (50 mg total) by mouth daily.  ? omeprazole (PRILOSEC) 20 MG capsule Take 20 mg by mouth daily.  ? ondansetron (ZOFRAN) 8 MG tablet Take by mouth every 8 (eight) hours as needed for nausea or vomiting.  ? phentermine (ADIPEX-P) 37.5 MG tablet Take 37.5 mg by mouth daily.  ? sulfamethoxazole-trimethoprim (BACTRIM DS) 800-160 MG tablet Take 1 tablet by mouth 2 (two) times daily.  ? SUMAtriptan (IMITREX) 25 MG tablet Take 1 tablet (25 mg total) by mouth every 2 (two) hours as needed for migraine. May repeat in 2 hours if headache persists or recurs.  ? diphenhydrAMINE (BENADRYL) 25 MG tablet Take 2 tablets (50 mg total) by mouth every 6 (six) hours for 2 days.  ? [DISCONTINUED] fluconazole (DIFLUCAN) 150 MG tablet Take 1 tablet (150 mg total) by mouth daily.  ? [DISCONTINUED] nystatin-triamcinolone (MYCOLOG II) cream Apply to affected area daily  ? ?No facility-administered encounter medications on file  as of 06/11/2021.  ? ? ?Follow-up: No follow-ups on file.  ? ?Kara Dies, NP ? ?

## 2021-06-12 ENCOUNTER — Other Ambulatory Visit: Payer: Self-pay

## 2021-06-12 MED ORDER — OMEPRAZOLE 20 MG PO CPDR: 20.0000 mg | DELAYED_RELEASE_CAPSULE | Freq: Every day | ORAL | 2 refills | Status: DC

## 2021-06-12 MED ORDER — ALBUTEROL SULFATE HFA 108 (90 BASE) MCG/ACT IN AERS: 1.0000 | INHALATION_SPRAY | Freq: Four times a day (QID) | RESPIRATORY_TRACT | 3 refills | Status: DC | PRN

## 2021-06-12 MED ORDER — BUSPIRONE HCL 15 MG PO TABS: 15.0000 mg | ORAL_TABLET | Freq: Two times a day (BID) | ORAL | 3 refills | Status: DC

## 2021-06-30 ENCOUNTER — Other Ambulatory Visit: Payer: Self-pay | Admitting: *Deleted

## 2021-06-30 MED ORDER — TRAZODONE HCL 50 MG PO TABS: 50.0000 mg | ORAL_TABLET | Freq: Every day | ORAL | 1 refills | Status: DC

## 2021-06-30 MED ORDER — SUMATRIPTAN SUCCINATE 25 MG PO TABS: 25.0000 mg | ORAL_TABLET | ORAL | 1 refills | Status: DC | PRN

## 2021-07-01 ENCOUNTER — Ambulatory Visit (INDEPENDENT_AMBULATORY_CARE_PROVIDER_SITE_OTHER): Payer: BC Managed Care – PPO | Admitting: Obstetrics

## 2021-07-01 ENCOUNTER — Other Ambulatory Visit (HOSPITAL_COMMUNITY)
Admission: RE | Admit: 2021-07-01 | Discharge: 2021-07-01 | Disposition: A | Discharge status: Home / Self Care | Payer: BC Managed Care – PPO | Source: Ambulatory Visit | Attending: Obstetrics | Admitting: Obstetrics

## 2021-07-01 ENCOUNTER — Encounter: Payer: Self-pay | Admitting: Obstetrics

## 2021-07-01 VITALS — BP 122/70 | Ht 65.0 in | Wt 207.0 lb

## 2021-07-01 DIAGNOSIS — Z124 Encounter for screening for malignant neoplasm of cervix: Secondary | ICD-10-CM | POA: Diagnosis not present

## 2021-07-01 DIAGNOSIS — Z01419 Encounter for gynecological examination (general) (routine) without abnormal findings: Secondary | ICD-10-CM | POA: Diagnosis not present

## 2021-07-01 NOTE — Progress Notes (Signed)
? ? ?Gynecology Annual Exam  ?PCP: Susan Charles, No Pcp Per (Inactive) ? ?Chief Complaint:  ?Chief Complaint  ?Susan Charles presents with  ? Annual Exam  ? ? ?History of Present Illness: Susan Charles is a 46 y.o. [id number] presents for annual exam. The Susan Charles has no complaints today.  ? ?LMP: No LMP recorded. ?Average Interval: regular, 28 days ?Duration of flow: 5 days ?Heavy Menses: over the first two days ?Clots: no ?Intermenstrual Bleeding: no ?Postcoital Bleeding: no ?Dysmenorrhea: no ? ? ?The Susan Charles is sexually active. She currently uses vasectomy for contraception. She denies dyspareunia.  The Susan Charles does perform self breast exams.  There is no notable family history of breast or ovarian cancer in her family. ? ?The Susan Charles wears seatbelts: yes.   The Susan Charles has regular exercise: no.   ? ?The Susan Charles denies current symptoms of depression.  She has a history of anxiety ? ?Review of Systems: Review of Systems  ?Constitutional: Negative.   ?HENT: Negative.    ?Eyes: Negative.   ?Respiratory: Negative.    ?Gastrointestinal: Negative.   ?Genitourinary:  Positive for dysuria and frequency.  ?Musculoskeletal: Negative.   ?Skin: Negative.   ?Neurological: Negative.   ?Endo/Heme/Allergies: Negative.   ?Psychiatric/Behavioral: Negative.    ? ?Past Medical History:  ?Susan Charles Active Problem List  ? Diagnosis Date Noted  ? Establishing care with new doctor, encounter for 06/11/2021  ? Urinary tract infection without hematuria 06/11/2021  ? Vitamin D deficiency 06/11/2021  ? ? ?Past Surgical History:  ?Past Surgical History:  ?Procedure Laterality Date  ? CESAREAN SECTION    ? x7  ? CHOLECYSTECTOMY    ? ? ?Gynecologic History:  ?No LMP recorded. ?Contraception: abstinance now ?Last Pap: Results were: 2021 NIL and HR HPV+  ?Last mammogram: 2021 Results were: BI-RAD I ? ?Obstetric History: [id number] ? ?Family History:  ?Family History  ?Problem Relation Age of Onset  ? Cancer Father   ? Breast cancer Neg Hx   ? ? ?Social History:   ?Social History  ? ?Socioeconomic History  ? Marital status: Single  ?  Spouse name: Not on file  ? Number of children: Not on file  ? Years of education: Not on file  ? Highest education level: Not on file  ?Occupational History  ? Not on file  ?Tobacco Use  ? Smoking status: Never  ? Smokeless tobacco: Never  ?Vaping Use  ? Vaping Use: Never used  ?Substance and Sexual Activity  ? Alcohol use: Never  ? Drug use: Never  ? Sexual activity: Yes  ?  Birth control/protection: Surgical  ?  Comment: Vasectomy  ?Other Topics Concern  ? Not on file  ?Social History Narrative  ? Not on file  ? ?Social Determinants of Health  ? ?Financial Resource Strain: Not on file  ?Food Insecurity: Not on file  ?Transportation Needs: Not on file  ?Physical Activity: Not on file  ?Stress: Not on file  ?Social Connections: Not on file  ?Intimate Partner Violence: Not on file  ? ? ?Allergies:  ?No Known Allergies ? ?Medications: ?Prior to Admission medications   ?Medication Sig Start Date End Date Taking? Authorizing Provider  ?albuterol (VENTOLIN HFA) 108 (90 Base) MCG/ACT inhaler Inhale 1 puff into the lungs every 6 (six) hours as needed for wheezing or shortness of breath. 06/12/21  Yes Kara Dies, NP  ?busPIRone (BUSPAR) 15 MG tablet Take 1 tablet (15 mg total) by mouth 2 (two) times daily. 06/12/21  Yes Kara Dies, NP  ?omeprazole (PRILOSEC) 20 MG  capsule Take 1 capsule (20 mg total) by mouth daily. 06/12/21  Yes Kara DiesKaur, Charanpreet, NP  ?ondansetron (ZOFRAN) 8 MG tablet Take by mouth every 8 (eight) hours as needed for nausea or vomiting.   Yes [provider]  ?sulfamethoxazole-trimethoprim (BACTRIM DS) 800-160 MG tablet Take 1 tablet by mouth 2 (two) times daily. 06/11/21  Yes Kara DiesKaur, Charanpreet, NP  ?SUMAtriptan (IMITREX) 25 MG tablet Take 1 tablet (25 mg total) by mouth every 2 (two) hours as needed for migraine. May repeat in 2 hours if headache persists or recurs. 06/30/21  Yes Masoud, Renda RollsJaved, MD  ?traZODone  (DESYREL) 50 MG tablet Take 1 tablet (50 mg total) by mouth at bedtime. 06/30/21  Yes Masoud, Renda RollsJaved, MD  ?diphenhydrAMINE (BENADRYL) 25 MG tablet Take 2 tablets (50 mg total) by mouth every 6 (six) hours for 2 days. 09/25/19 09/27/19  Sharman CheekStafford, Phillip, MD  ?fluconazole (DIFLUCAN) 50 MG tablet Take 1 tablet (50 mg total) by mouth daily. ?Susan Charles not taking: Reported on 07/01/2021 06/11/21   Kara DiesKaur, Charanpreet, NP  ?phentermine (ADIPEX-P) 37.5 MG tablet Take 37.5 mg by mouth daily. ?Susan Charles not taking: Reported on 07/01/2021 06/17/20   [provider]  ? ? ?Physical Exam ?Vitals: Blood pressure 122/70, height 5\' 5"  (1.651 m), weight 207 lb (93.9 kg). ? ?General: NAD, BMI 34 ?HEENT: normocephalic, anicteric ?Thyroid: no enlargement, no palpable nodules ?Pulmonary: No increased work of breathing, CTAB ?Cardiovascular: RRR, distal pulses 2+ ?Breast: Breast symmetrical, no tenderness, no palpable nodules or masses, no skin or nipple retraction present, no nipple discharge.  No axillary or supraclavicular lymphadenopathy. ?Abdomen: NABS, soft, non-tender, non-distended.  Umbilicus without lesions.  No hepatomegaly, splenomegaly or masses palpable. No evidence of hernia  ?Genitourinary: ? External: Normal external female genitalia.  Normal urethral meatus, normal Bartholin's and Skene's glands.   ? Vagina: Normal vaginal mucosa, mild cystocele noted.  ? Cervix: Grossly normal in appearance, no bleeding ? Uterus: Non-enlarged, mobile, normal contour.  No CMT ? Adnexa: ovaries non-enlarged, no adnexal masses ? Rectal: deferred ? Lymphatic: no evidence of inguinal lymphadenopathy ?Extremities: no edema, erythema, or tenderness ?Neurologic: Grossly intact ?Psychiatric: mood appropriate, affect full ? ?Female chaperone present for pelvic and breast  portions of the physical exam ? ? ? ?Assessment: 46 y.o. [id number] routine annual exam ? ?Plan: ?Problem List Items Addressed This Visit   ?None ?Visit Diagnoses   ? ? Cervical cancer  screening    -  Primary  ? Relevant Orders  ? Cytology - PAP  ? Women's annual routine gynecological examination      ? Relevant Orders  ? Cytology - PAP  ? ?  ? ? ?1) Mammogram - recommend yearly screening mammogram.  Mammogram Is up to date ? ? ?2) STI screening  wasoffered and declined ? ?3) ASCCP guidelines and rational discussed.  Susan Charles opts for  annual  due to being high Risk HPV. We will  screening interval ? ?4) Contraception - the Susan Charles is currently using  abstinance She is separated from her ex.  She is happy with her current form of contraception and plans to continue ? ?5) Colonoscopy -- Screening recommended starting at age 46 for average risk individuals, age 46 for individuals deemed at increased risk (including African Americans) and recommended to continue until age 46.  For Susan Charles age 46-85 individualized approach is recommended.  Gold standard screening is via colonoscopy, Cologuard screening is an acceptable alternative for Susan Charles unwilling or unable to undergo colonoscopy.  "Colorectal cancer screening for average?risk  adults: 2018 guideline update from the American Cancer Society"CA: A Cancer Journal for Clinicians: Aug 25, 2016  ? ?6) Routine healthcare maintenance including cholesterol, diabetes screening discussed managed by PCP ? ?7) Return in about 1 year (around 07/02/2022) for annual, pap. ?She has  a hx of recurrent UTIs  ?That are being followed by her PCP. She declines any UA today.We discussed the option of daily pro biotic and cranberry extract capsules to address her recurrent UTI symptoms. ? ? ?Mirna Mires, CNM  ?07/01/2021 4:44 PM  ? ?[facility] ?07/01/2021, 4:44 PM ? ? ? ? ? ? ?  ?

## 2021-07-07 ENCOUNTER — Other Ambulatory Visit: Payer: Self-pay | Admitting: Nurse Practitioner

## 2021-07-07 LAB — CYTOLOGY - PAP
Comment: NEGATIVE
Comment: NEGATIVE
Comment: NEGATIVE
Diagnosis: NEGATIVE
HPV 16: NEGATIVE
HPV 18 / 45: NEGATIVE
High risk HPV: POSITIVE — AB

## 2021-07-08 ENCOUNTER — Encounter: Payer: Self-pay | Admitting: Obstetrics

## 2021-07-08 NOTE — Progress Notes (Signed)
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 She is notified via My chart. She prefers annual pap smears. Plans on repeating at her annual Gyn PE next year. ?Mirna Mires, CNM  ?07/08/2021 8:22 AM  ? ?

## 2021-07-16 ENCOUNTER — Encounter: Payer: Self-pay | Admitting: Nurse Practitioner

## 2021-07-16 ENCOUNTER — Ambulatory Visit (INDEPENDENT_AMBULATORY_CARE_PROVIDER_SITE_OTHER): Payer: BC Managed Care – PPO | Admitting: Nurse Practitioner

## 2021-07-16 VITALS — BP 125/74 | HR 70 | Ht 65.0 in | Wt 208.5 lb

## 2021-07-16 DIAGNOSIS — N390 Urinary tract infection, site not specified: Secondary | ICD-10-CM | POA: Diagnosis not present

## 2021-07-16 LAB — POCT URINALYSIS DIPSTICK
Appearance: ABNORMAL
Glucose, UA: POSITIVE — AB
Protein, UA: NEGATIVE
Spec Grav, UA: 1.02 (ref 1.010–1.025)
Urobilinogen, UA: 4 E.U./dL — AB
pH, UA: 5 (ref 5.0–8.0)

## 2021-07-16 MED ORDER — FLUCONAZOLE 50 MG PO TABS: 50.0000 mg | ORAL_TABLET | Freq: Every day | ORAL | 0 refills | Status: DC

## 2021-07-16 MED ORDER — SULFAMETHOXAZOLE-TRIMETHOPRIM 800-160 MG PO TABS: 1.0000 | ORAL_TABLET | Freq: Two times a day (BID) | ORAL | 0 refills | Status: DC

## 2021-07-20 ENCOUNTER — Other Ambulatory Visit
Admission: RE | Admit: 2021-07-20 | Discharge: 2021-07-20 | Disposition: A | Discharge status: Home / Self Care | Payer: BC Managed Care – PPO | Attending: Nurse Practitioner | Admitting: Nurse Practitioner

## 2021-07-20 DIAGNOSIS — N390 Urinary tract infection, site not specified: Secondary | ICD-10-CM | POA: Diagnosis present

## 2021-07-21 LAB — URINE CULTURE: Culture: <10000 — AB

## 2021-07-22 ENCOUNTER — Other Ambulatory Visit: Payer: Self-pay | Admitting: Internal Medicine

## 2021-07-22 NOTE — Assessment & Plan Note (Signed)
Uranalysis positive for leuckocytes. ?Started on Bactrim and diflucan ? ?

## 2021-07-22 NOTE — Progress Notes (Signed)
? ?Established Patient Office Visit ? ?Subjective:  ?Patient ID: Susan Charles, female    DOB: Sep 14, 1975  Age: 46 y.o. MRN: [id number] ? ?CC:  ?Chief Complaint  ?Patient presents with  ? Urinary Tract Infection  ?  Patient has been having lower back pain/abdominal pain x 1 month. Patient has complaints of frequency with some burning/pressure when urinating. Patient has been taking azo for relief of sxs.   ? ? ? ?HPI ? ?[name] presents with dysuria, urinary frequency, lower back pain and lower abdominal pain from last 2-3 days. Patient report history to UTI. She had a UTI last month.   ? ?Urinary Tract Infection  ?Associated symptoms include frequency.   ? ?Past Medical History:  ?Diagnosis Date  ? Asthma   ? Migraine   ? ? ?Past Surgical History:  ?Procedure Laterality Date  ? CESAREAN SECTION    ? x7  ? CHOLECYSTECTOMY    ? ? ?Family History  ?Problem Relation Age of Onset  ? Cancer Father   ? Breast cancer Neg Hx   ? ? ?Social History  ? ?Socioeconomic History  ? Marital status: Single  ?  Spouse name: Not on file  ? Number of children: Not on file  ? Years of education: Not on file  ? Highest education level: Not on file  ?Occupational History  ? Not on file  ?Tobacco Use  ? Smoking status: Never  ? Smokeless tobacco: Never  ?Vaping Use  ? Vaping Use: Never used  ?Substance and Sexual Activity  ? Alcohol use: Never  ? Drug use: Never  ? Sexual activity: Yes  ?  Birth control/protection: Surgical  ?  Comment: Vasectomy  ?Other Topics Concern  ? Not on file  ?Social History Narrative  ? Not on file  ? ?Social Determinants of Health  ? ?Financial Resource Strain: Not on file  ?Food Insecurity: Not on file  ?Transportation Needs: Not on file  ?Physical Activity: Not on file  ?Stress: Not on file  ?Social Connections: Not on file  ?Intimate Partner Violence: Not on file  ? ? ? ?Outpatient Medications Prior to Visit  ?Medication Sig Dispense Refill  ? albuterol (VENTOLIN HFA) 108 (90 Base) MCG/ACT inhaler  Inhale 1 puff into the lungs every 6 (six) hours as needed for wheezing or shortness of breath. 18 g 3  ? busPIRone (BUSPAR) 15 MG tablet TAKE 1 TABLET BY MOUTH 2 TIMES DAILY. 180 tablet 2  ? omeprazole (PRILOSEC) 20 MG capsule Take 1 capsule (20 mg total) by mouth daily. 90 capsule 2  ? ondansetron (ZOFRAN) 8 MG tablet Take by mouth every 8 (eight) hours as needed for nausea or vomiting.    ? phentermine (ADIPEX-P) 37.5 MG tablet Take 37.5 mg by mouth daily.    ? SUMAtriptan (IMITREX) 25 MG tablet Take 1 tablet (25 mg total) by mouth every 2 (two) hours as needed for migraine. May repeat in 2 hours if headache persists or recurs. 10 tablet 1  ? traZODone (DESYREL) 50 MG tablet Take 1 tablet (50 mg total) by mouth at bedtime. 30 tablet 1  ? fluconazole (DIFLUCAN) 50 MG tablet Take 1 tablet (50 mg total) by mouth daily. 2 tablet 0  ? sulfamethoxazole-trimethoprim (BACTRIM DS) 800-160 MG tablet Take 1 tablet by mouth 2 (two) times daily. 10 tablet 0  ? diphenhydrAMINE (BENADRYL) 25 MG tablet Take 2 tablets (50 mg total) by mouth every 6 (six) hours for 2 days. 16 tablet 0  ? ?No  facility-administered medications prior to visit.  ? ? ?No Known Allergies ? ?ROS ?Review of Systems  ?Constitutional:  Negative for activity change and appetite change.  ?HENT:  Negative for congestion and facial swelling.   ?Eyes:  Negative for discharge and redness.  ?Respiratory:  Negative for cough, chest tightness and shortness of breath.   ?Cardiovascular:  Negative for chest pain and palpitations.  ?Gastrointestinal:  Positive for abdominal pain. Negative for abdominal distention and diarrhea.  ?Genitourinary:  Positive for dysuria and frequency.  ?Musculoskeletal:  Negative for arthralgias and myalgias.  ?Neurological:  Negative for dizziness, facial asymmetry and headaches.  ?Psychiatric/Behavioral:  Negative for agitation, behavioral problems and confusion.   ? ?  ?Objective:  ?  ?Physical Exam ?Constitutional:   ?   Appearance:  Normal appearance. She is obese.  ?HENT:  ?   Head: Normocephalic.  ?   Right Ear: Tympanic membrane normal.  ?   Left Ear: Tympanic membrane normal.  ?   Nose: Nose normal.  ?   Mouth/Throat:  ?   Mouth: Mucous membranes are moist.  ?   Pharynx: Oropharynx is clear.  ?Eyes:  ?   Extraocular Movements: Extraocular movements intact.  ?   Conjunctiva/sclera: Conjunctivae normal.  ?   Pupils: Pupils are equal, round, and reactive to light.  ?Cardiovascular:  ?   Rate and Rhythm: Normal rate and regular rhythm.  ?   Pulses: Normal pulses.  ?   Heart sounds: Normal heart sounds.  ?Pulmonary:  ?   Effort: Pulmonary effort is normal.  ?   Breath sounds: Normal breath sounds.  ?Abdominal:  ?   General: Bowel sounds are normal.  ?   Palpations: Abdomen is soft.  ?Genitourinary: ?   Comments: Suprapubic tenderness ?Musculoskeletal:     ?   General: Normal range of motion.  ?   Cervical back: Normal range of motion and neck supple.  ?Skin: ?   General: Skin is warm.  ?   Capillary Refill: Capillary refill takes less than 2 seconds.  ?Neurological:  ?   General: No focal deficit present.  ?   Mental Status: She is alert and oriented to person, place, and time. Mental status is at baseline.  ?Psychiatric:     ?   Mood and Affect: Mood normal.     ?   Behavior: Behavior normal.     ?   Thought Content: Thought content normal.     ?   Judgment: Judgment normal.  ? ? ?BP 125/74   Pulse 70   Ht '5\' 5"'  (1.651 m)   Wt 208 lb 8 oz (94.6 kg)   BMI 34.70 kg/m?  ?Wt Readings from Last 3 Encounters:  ?07/16/21 208 lb 8 oz (94.6 kg)  ?07/01/21 207 lb (93.9 kg)  ?06/11/21 201 lb 12.8 oz (91.5 kg)  ? ? ? ?Health Maintenance Due  ?Topic Date Due  ? HIV Screening  Never done  ? Hepatitis C Screening  Never done  ? TETANUS/TDAP  Never done  ? COVID-19 Vaccine (3 - Booster for Pf[employer] series) 07/12/2020  ? COLONOSCOPY (Pts 45-38yr Insurance coverage will need to be confirmed)  Never done  ? ? ?There are no preventive care reminders to display  for this patient. ? ?No results found for: TSH ?No results found for: WBC, HGB, HCT, MCV, PLT ?No results found for: NA, K, CHLORIDE, CO2, GLUCOSE, BUN, CREATININE, BILITOT, ALKPHOS, AST, ALT, PROT, ALBUMIN, CALCIUM, ANIONGAP, EGFR, GFR ?No results found for:  CHOL ?No results found for: HDL ?No results found for: [place] ?No results found for: TRIG ?No results found for: CHOLHDL ?No results found for: HGBA1C ? ?  ?Assessment & Plan:  ? ?Problem List Items Addressed This Visit   ? ?  ? Genitourinary  ? Urinary tract infection without hematuria - Primary  ?  Uranalysis positive for leuckocytes. ?Started on Bactrim and diflucan ? ? ?  ?  ? Relevant Medications  ? sulfamethoxazole-trimethoprim (BACTRIM DS) 800-160 MG tablet  ? fluconazole (DIFLUCAN) 50 MG tablet  ? Other Relevant Orders  ? POCT urinalysis dipstick (Completed)  ? Urine Culture  ? ? ? ?Meds ordered this encounter  ?Medications  ? sulfamethoxazole-trimethoprim (BACTRIM DS) 800-160 MG tablet  ?  Sig: Take 1 tablet by mouth 2 (two) times daily.  ?  Dispense:  10 tablet  ?  Refill:  0  ? fluconazole (DIFLUCAN) 50 MG tablet  ?  Sig: Take 1 tablet (50 mg total) by mouth daily.  ?  Dispense:  2 tablet  ?  Refill:  0  ? ? ? ?Follow-up: No follow-ups on file.  ? ? ?Theresia Lo, NP ?

## 2021-08-14 ENCOUNTER — Telehealth: Payer: Self-pay

## 2021-08-14 NOTE — Telephone Encounter (Signed)
Pt had called triage and left a message with the Access Nurse. Pt called needing a refill on her nystatin and diflucan, I called pt back no answer and no voicemail box set up .

## 2021-08-18 ENCOUNTER — Encounter: Payer: Self-pay | Admitting: Internal Medicine

## 2021-08-18 ENCOUNTER — Ambulatory Visit (INDEPENDENT_AMBULATORY_CARE_PROVIDER_SITE_OTHER): Payer: BC Managed Care – PPO | Admitting: Internal Medicine

## 2021-08-18 VITALS — BP 129/82 | HR 65 | Ht 65.0 in | Wt 208.5 lb

## 2021-08-18 DIAGNOSIS — R350 Frequency of micturition: Secondary | ICD-10-CM | POA: Diagnosis not present

## 2021-08-18 DIAGNOSIS — E8881 Metabolic syndrome: Secondary | ICD-10-CM | POA: Diagnosis not present

## 2021-08-18 DIAGNOSIS — N390 Urinary tract infection, site not specified: Secondary | ICD-10-CM

## 2021-08-18 DIAGNOSIS — E559 Vitamin D deficiency, unspecified: Secondary | ICD-10-CM | POA: Diagnosis not present

## 2021-08-18 LAB — POCT URINALYSIS DIPSTICK
Appearance: NORMAL
Bilirubin, UA: NEGATIVE
Blood, UA: NEGATIVE
Glucose, UA: NEGATIVE
Ketones, UA: NEGATIVE
Leukocytes, UA: NEGATIVE
Nitrite, UA: NEGATIVE
Protein, UA: NEGATIVE
Spec Grav, UA: 1.015 (ref 1.010–1.025)
Urobilinogen, UA: 0.2 E.U./dL
pH, UA: 7 (ref 5.0–8.0)

## 2021-08-18 NOTE — Progress Notes (Signed)
Established Patient Office Visit  Subjective:  Patient ID: Susan Charles, female    DOB: Nov 30, 1975  Age: 46 y.o. MRN: [id number]  CC:  Chief Complaint  Patient presents with   Eye Pain    Patient complains of eye redness, sand felling of the eye, and drainage in both eyes but mostly the left eye.   Urinary Tract Infection    Patient complains of lower back pain and frequency.    Eye Pain   Urinary Tract Infection    Susan Charles presents for check up  Past Medical History:  Diagnosis Date   Asthma    Migraine     Past Surgical History:  Procedure Laterality Date   CESAREAN SECTION     x7   CHOLECYSTECTOMY      Family History  Problem Relation Age of Onset   Cancer Father    Breast cancer Neg Hx     Social History   Socioeconomic History   Marital status: Single    Spouse name: Not on file   Number of children: Not on file   Years of education: Not on file   Highest education level: Not on file  Occupational History   Not on file  Tobacco Use   Smoking status: Never   Smokeless tobacco: Never  Vaping Use   Vaping Use: Never used  Substance and Sexual Activity   Alcohol use: Never   Drug use: Never   Sexual activity: Yes    Birth control/protection: Surgical    Comment: Vasectomy  Other Topics Concern   Not on file  Social History Narrative   Not on file   Social Determinants of Health   Financial Resource Strain: Not on file  Food Insecurity: Not on file  Transportation Needs: Not on file  Physical Activity: Not on file  Stress: Not on file  Social Connections: Not on file  Intimate Partner Violence: Not on file     Current Outpatient Medications:    albuterol (VENTOLIN HFA) 108 (90 Base) MCG/ACT inhaler, Inhale 1 puff into the lungs every 6 (six) hours as needed for wheezing or shortness of breath., Disp: 18 g, Rfl: 3   busPIRone (BUSPAR) 15 MG tablet, TAKE 1 TABLET BY MOUTH 2 TIMES DAILY., Disp: 180 tablet, Rfl: 2   omeprazole  (PRILOSEC) 20 MG capsule, Take 1 capsule (20 mg total) by mouth daily., Disp: 90 capsule, Rfl: 2   ondansetron (ZOFRAN) 8 MG tablet, Take by mouth every 8 (eight) hours as needed for nausea or vomiting., Disp: , Rfl:    SUMAtriptan (IMITREX) 25 MG tablet, Take 1 tablet (25 mg total) by mouth every 2 (two) hours as needed for migraine. May repeat in 2 hours if headache persists or recurs., Disp: 10 tablet, Rfl: 1   traZODone (DESYREL) 50 MG tablet, TAKE 1 TABLET BY MOUTH EVERYDAY AT BEDTIME, Disp: 90 tablet, Rfl: 1   No Known Allergies  ROS Review of Systems  Constitutional: Negative.   HENT: Negative.    Eyes:  Positive for pain.  Respiratory: Negative.    Cardiovascular: Negative.   Gastrointestinal: Negative.   Endocrine: Negative.   Genitourinary: Negative.   Musculoskeletal: Negative.   Skin: Negative.   Allergic/Immunologic: Negative.   Neurological: Negative.   Hematological: Negative.   Psychiatric/Behavioral: Negative.    All other systems reviewed and are negative.    Objective:    Physical Exam Vitals reviewed.  Constitutional:      Appearance: Normal appearance.  HENT:  Mouth/Throat:     Mouth: Mucous membranes are moist.  Eyes:     Pupils: Pupils are equal, round, and reactive to light.  Neck:     Vascular: No carotid bruit.  Cardiovascular:     Rate and Rhythm: Normal rate and regular rhythm.     Pulses: Normal pulses.     Heart sounds: Normal heart sounds.  Pulmonary:     Effort: Pulmonary effort is normal.     Breath sounds: Normal breath sounds.  Abdominal:     General: Bowel sounds are normal.     Palpations: Abdomen is soft. There is no hepatomegaly, splenomegaly or mass.     Tenderness: There is no abdominal tenderness.     Hernia: No hernia is present.  Musculoskeletal:        General: No tenderness.     Cervical back: Neck supple.     Right lower leg: No edema.     Left lower leg: No edema.  Skin:    Findings: No rash.  Neurological:      Mental Status: She is alert and oriented to person, place, and time.     Motor: No weakness.  Psychiatric:        Mood and Affect: Mood and affect normal.        Behavior: Behavior normal.    BP 129/82   Pulse 65   Ht _0  (1.651 m)   Wt 208 lb 8 oz (94.6 kg)   BMI 34.70 kg/m  Wt Readings from Last 3 Encounters:  08/18/21 208 lb 8 oz (94.6 kg)  07/16/21 208 lb 8 oz (94.6 kg)  07/01/21 207 lb (93.9 kg)     Health Maintenance Due  Topic Date Due   HIV Screening  Never done   Hepatitis C Screening  Never done   TETANUS/TDAP  Never done   COVID-19 Vaccine (3 - Booster for [employer] series) 07/12/2020   COLONOSCOPY (Pts 45-41yr Insurance coverage will need to be confirmed)  Never done    There are no preventive care reminders to display for this patient.  No results found for: TSH No results found for: WBC, HGB, HCT, MCV, PLT No results found for: NA, K, CHLORIDE, CO2, GLUCOSE, BUN, CREATININE, BILITOT, ALKPHOS, AST, ALT, PROT, ALBUMIN, CALCIUM, ANIONGAP, EGFR, GFR No results found for: CHOL No results found for: HDL No results found for: LDLCALC No results found for: TRIG No results found for: CHOLHDL No results found for: HGBA1C    Assessment & Plan:   Problem List Items Addressed This Visit       Genitourinary   Urinary tract infection without hematuria    Refer to urologist, patient had multiple visits to the ER and doctor's UTI       Relevant Orders   Ambulatory referral to Urology     Other   Vitamin D deficiency    Take vitamin D 1000 units p.o. daily       Metabolic syndrome    - I encouraged the patient to lose weight.  - I educated them on making healthy dietary choices including eating more fruits and vegetables and less fried foods. - I encouraged the patient to exercise more, and educated on the benefits of exercise including weight loss, diabetes prevention, and hypertension prevention.   Dietary counseling with a registered  dietician  Referral to a weight management support group (e.g. Weight Watchers, Overeaters Anonymous)  If your BMI is greater than 29 or you have gained more  than 15 pounds you should work on weight loss.  Attend a healthy cooking class        Other Visit Diagnoses     Urinary frequency    -  Primary   Relevant Orders   POCT urinalysis dipstick (Completed)   Ambulatory referral to Urology       No orders of the defined types were placed in this encounter.   Follow-up: No follow-ups on file.    Cletis Athens, MD

## 2021-08-18 NOTE — Assessment & Plan Note (Signed)
Refer to urologist, patient had multiple visits to the ER and doctor's UTI

## 2021-08-18 NOTE — Assessment & Plan Note (Signed)

## 2021-08-18 NOTE — Assessment & Plan Note (Signed)
Take vitamin D 1000 units po daily.  

## 2021-08-19 ENCOUNTER — Ambulatory Visit: Payer: BC Managed Care – PPO | Admitting: *Deleted

## 2021-08-19 DIAGNOSIS — Z1329 Encounter for screening for other suspected endocrine disorder: Secondary | ICD-10-CM

## 2021-08-19 DIAGNOSIS — E559 Vitamin D deficiency, unspecified: Secondary | ICD-10-CM

## 2021-08-19 DIAGNOSIS — Z114 Encounter for screening for human immunodeficiency virus [HIV]: Secondary | ICD-10-CM

## 2021-08-19 DIAGNOSIS — Z1159 Encounter for screening for other viral diseases: Secondary | ICD-10-CM

## 2021-08-19 DIAGNOSIS — E8881 Metabolic syndrome: Secondary | ICD-10-CM

## 2021-08-19 DIAGNOSIS — Z13220 Encounter for screening for lipoid disorders: Secondary | ICD-10-CM

## 2021-08-20 ENCOUNTER — Encounter: Payer: Self-pay | Admitting: *Deleted

## 2021-08-20 LAB — COMPLETE METABOLIC PANEL WITH GFR
AG Ratio: 1.6 (calc) (ref 1.0–2.5)
ALT: 10 U/L (ref 6–29)
AST: 16 U/L (ref 10–35)
Albumin: 4.2 g/dL (ref 3.6–5.1)
Alkaline phosphatase (APISO): 45 U/L (ref 31–125)
BUN: 10 mg/dL (ref 7–25)
CO2: 18 mmol/L — ABNORMAL LOW (ref 20–32)
Calcium: 9.2 mg/dL (ref 8.6–10.2)
Chloride: 109 mmol/L (ref 98–110)
Creat: 0.68 mg/dL (ref 0.50–0.99)
Globulin: 2.6 g/dL (calc) (ref 1.9–3.7)
Glucose, Bld: 85 mg/dL (ref 65–99)
Potassium: 4.4 mmol/L (ref 3.5–5.3)
Sodium: 138 mmol/L (ref 135–146)
Total Bilirubin: 0.6 mg/dL (ref 0.2–1.2)
Total Protein: 6.8 g/dL (ref 6.1–8.1)
eGFR: 109 mL/min/{1.73_m2} (ref >=60)

## 2021-08-20 LAB — CBC WITH DIFFERENTIAL/PLATELET
Absolute Monocytes: 631 cells/uL (ref 200–950)
Basophils Absolute: 123 cells/uL (ref 0–200)
Basophils Relative: 1.5 %
Eosinophils Absolute: 131 cells/uL (ref 15–500)
Eosinophils Relative: 1.6 %
HCT: 41.7 % (ref 35.0–45.0)
Hemoglobin: 13.4 g/dL (ref 11.7–15.5)
Lymphs Abs: 1919 cells/uL (ref 850–3900)
MCH: 31.2 pg (ref 27.0–33.0)
MCHC: 32.1 g/dL (ref 32.0–36.0)
MCV: 97.2 fL (ref 80.0–100.0)
MPV: 9.5 fL (ref 7.5–12.5)
Monocytes Relative: 7.7 %
Neutro Abs: 5396 cells/uL (ref 1500–7800)
Neutrophils Relative %: 65.8 %
Platelets: 305 10*3/uL (ref 140–400)
RBC: 4.29 10*6/uL (ref 3.80–5.10)
RDW: 12 % (ref 11.0–15.0)
Total Lymphocyte: 23.4 %
WBC: 8.2 10*3/uL (ref 3.8–10.8)

## 2021-08-20 LAB — HEPATITIS C ANTIBODY
Hepatitis C Ab: NONREACTIVE
SIGNAL TO CUT-OFF: 0.07 (ref <1.00)

## 2021-08-20 LAB — TSH: TSH: 1.33 mIU/L

## 2021-08-20 LAB — HIV ANTIBODY (ROUTINE TESTING W REFLEX): HIV 1&2 Ab, 4th Generation: NONREACTIVE

## 2021-08-20 LAB — LIPID PANEL
Cholesterol: 189 mg/dL (ref <200)
HDL: 48 mg/dL — ABNORMAL LOW (ref >=50)
LDL Cholesterol (Calc): 119 mg/dL (calc) — ABNORMAL HIGH
Non-HDL Cholesterol (Calc): 141 mg/dL (calc) — ABNORMAL HIGH (ref <130)
Total CHOL/HDL Ratio: 3.9 (calc) (ref <5.0)
Triglycerides: 113 mg/dL (ref <150)

## 2021-08-20 LAB — T3: T3, Total: 122 ng/dL (ref 76–181)

## 2021-08-20 LAB — T4: T4, Total: 8 ug/dL (ref 5.1–11.9)

## 2021-08-20 LAB — VITAMIN D 25 HYDROXY (VIT D DEFICIENCY, FRACTURES): Vit D, 25-Hydroxy: >150 ng/mL — ABNORMAL HIGH (ref 30–100)

## 2021-08-25 ENCOUNTER — Other Ambulatory Visit: Payer: Self-pay | Admitting: *Deleted

## 2021-08-25 ENCOUNTER — Other Ambulatory Visit: Payer: Self-pay | Admitting: Internal Medicine

## 2021-08-25 MED ORDER — ONDANSETRON HCL 8 MG PO TABS: 16.0000 mg | ORAL_TABLET | Freq: Three times a day (TID) | ORAL | 1 refills | Status: DC | PRN

## 2021-08-25 MED ORDER — MECLIZINE HCL 25 MG PO TABS
25.0000 mg | ORAL_TABLET | Freq: Three times a day (TID) | ORAL | 1 refills | Status: DC | PRN
Start: 2021-08-25 — End: 2022-05-11

## 2021-08-25 MED ORDER — ONDANSETRON HCL 8 MG PO TABS: 8.0000 mg | ORAL_TABLET | Freq: Three times a day (TID) | ORAL | 1 refills | Status: DC | PRN

## 2021-08-26 ENCOUNTER — Other Ambulatory Visit: Payer: Self-pay | Admitting: Internal Medicine

## 2021-08-28 NOTE — Progress Notes (Signed)
Please call pt with the abnormal result and make a follow up appointment.

## 2021-10-08 ENCOUNTER — Telehealth: Payer: Self-pay

## 2021-10-08 NOTE — Telephone Encounter (Signed)
Patient contacted office back lmtcb, I attempted to reach patient but voice answering service is not set up yet. KW

## 2021-10-08 NOTE — Telephone Encounter (Signed)
Patient contacted office request prescription to treat for yeast infection., Patient reports itchiness and burning.

## 2021-10-08 NOTE — Telephone Encounter (Signed)
Attempted to reach back out to patient to triage further, but voicemail box has not been set up. Unable to leave message. Will attempt to reach back out to patient again this afternoon to triage. KW

## 2021-10-10 ENCOUNTER — Other Ambulatory Visit: Payer: Self-pay | Admitting: Licensed Practical Nurse

## 2021-10-10 DIAGNOSIS — B3731 Acute candidiasis of vulva and vagina: Secondary | ICD-10-CM

## 2021-10-10 MED ORDER — FLUCONAZOLE 150 MG PO TABS: 150.0000 mg | ORAL_TABLET | ORAL | 0 refills | Status: AC

## 2021-10-10 MED ORDER — NYSTATIN-TRIAMCINOLONE 100000-0.1 UNIT/GM-% EX CREA: 1.0000 | TOPICAL_CREAM | Freq: Two times a day (BID) | CUTANEOUS | 0 refills | Status: DC

## 2021-10-10 NOTE — Progress Notes (Signed)
TC from Eva: Pt has had intense vaginal itching x 1 week, she has not seen any abnormal l discharge.  She has had this in the past and believes it is yeast, desires Diflucan and Nystatin. Prescriptions sent to [facility] in [place].  Understands she needs will need to be seen in the office if symptoms do on improve with treatment. Carie Caddy, CNM  [facility], [facility]  10/10/21  10:02 AM

## 2021-10-17 ENCOUNTER — Other Ambulatory Visit: Payer: Self-pay | Admitting: Advanced Practice Midwife

## 2021-10-17 DIAGNOSIS — B3731 Acute candidiasis of vulva and vagina: Secondary | ICD-10-CM

## 2021-10-17 MED ORDER — FLUCONAZOLE 150 MG PO TABS: 150.0000 mg | ORAL_TABLET | Freq: Once | ORAL | 3 refills | Status: AC

## 2021-10-17 NOTE — Progress Notes (Signed)
Reorder diflucan per patient request. If symptoms persist needs to be seen in office.

## 2021-11-06 ENCOUNTER — Other Ambulatory Visit: Payer: Self-pay

## 2021-11-06 ENCOUNTER — Other Ambulatory Visit: Payer: Self-pay | Admitting: Licensed Practical Nurse

## 2021-11-06 DIAGNOSIS — B3731 Acute candidiasis of vulva and vagina: Secondary | ICD-10-CM

## 2021-11-06 MED ORDER — ONDANSETRON HCL 8 MG PO TABS: 8.0000 mg | ORAL_TABLET | Freq: Three times a day (TID) | ORAL | 1 refills | Status: DC | PRN

## 2021-11-06 MED ORDER — SUMATRIPTAN SUCCINATE 25 MG PO TABS: ORAL_TABLET | ORAL | 1 refills | Status: DC

## 2021-11-06 MED ORDER — ALBUTEROL SULFATE HFA 108 (90 BASE) MCG/ACT IN AERS
1.0000 | INHALATION_SPRAY | Freq: Four times a day (QID) | RESPIRATORY_TRACT | 3 refills | Status: AC | PRN
Start: 2021-11-06 — End: ?

## 2021-11-06 MED ORDER — OMEPRAZOLE 20 MG PO CPDR: 20.0000 mg | DELAYED_RELEASE_CAPSULE | Freq: Every day | ORAL | 2 refills | Status: AC

## 2021-11-23 ENCOUNTER — Ambulatory Visit: Payer: BC Managed Care – PPO | Admitting: Urology

## 2022-01-09 ENCOUNTER — Other Ambulatory Visit: Payer: Self-pay | Admitting: Internal Medicine

## 2022-01-18 ENCOUNTER — Encounter: Payer: Self-pay | Admitting: Urology

## 2022-01-18 ENCOUNTER — Ambulatory Visit: Payer: BC Managed Care – PPO | Admitting: Urology

## 2022-03-02 ENCOUNTER — Other Ambulatory Visit: Payer: Self-pay

## 2022-03-02 MED ORDER — AZITHROMYCIN 250 MG PO TABS: ORAL_TABLET | ORAL | 0 refills | Status: AC

## 2022-03-02 MED ORDER — FLUCONAZOLE 150 MG PO TABS: 150.0000 mg | ORAL_TABLET | Freq: Once | ORAL | 0 refills | Status: AC

## 2022-03-02 NOTE — Telephone Encounter (Signed)
Patient called to say she had coughing and headache and congestion, no fever. Per Dr Juel Burrow we sent in zpak and diflucan at request of patient.

## 2022-03-24 ENCOUNTER — Telehealth: Payer: Self-pay

## 2022-03-24 ENCOUNTER — Other Ambulatory Visit: Payer: Self-pay

## 2022-03-24 DIAGNOSIS — B3731 Acute candidiasis of vulva and vagina: Secondary | ICD-10-CM

## 2022-03-24 MED ORDER — FLUCONAZOLE 150 MG PO TABS: 150.0000 mg | ORAL_TABLET | Freq: Once | ORAL | 0 refills | Status: AC

## 2022-03-24 NOTE — Telephone Encounter (Signed)
Pt calling; has yeast inf from taking antibx; requesting diflucan; monistat doesn't work for her.  [phone] aware diflucan sent in and if no relief to be seen.

## 2022-05-03 DIAGNOSIS — F4310 Post-traumatic stress disorder, unspecified: Secondary | ICD-10-CM | POA: Diagnosis not present

## 2022-05-10 NOTE — Progress Notes (Signed)
PCP: Corky Downs, MD   Chief Complaint  Patient presents with   Gynecologic Exam   Urinary Tract Infection    Frequency and little burning urinating, lower back pain on both sides x 1 month   Pelvic Pain    X 1 month    HPI:      [name] is a 47 y.o. [id number] whose LMP was Patient's last menstrual period was 05/01/2022 (approximate)., presents today for her annual examination.  Her menses are regular every 28-30 days, lasting 5 days.  Dysmenorrhea mild, improved with NSAIDs. She does not have intermenstrual bleeding.  Sex activity: not sexually active currently. No vag sx.   Last Pap: 07/01/21 and 06/24/20 Results were: no abnormalities /POS HPV DNA. No colpo done. Repeat pap due today. Hx of STDs: HPV  Last mammogram: 03/09/21 Results were: normal--routine follow-up in 12 months There is no FH of breast cancer. There is no FH of ovarian cancer. The patient does do self-breast exams.  Colonoscopy: never, interested in Cologuard, no rectal bleeding.  Tobacco use: The patient denies current or previous tobacco use. Alcohol use: social drinker No drug use Exercise: moderately active  She does not get adequate calcium but does get Vitamin D in her diet. Labs with PCP  Pt with UTI sx of frequency, dysuria, LBP, pelvic pain for past month, no hematuria. Hx of UTI sx in past with neg C&S. Had considered urology ref. Drinking lots of water, 1-2 caffeinated drinks daily. Not currently sexually active. Macrobid doesn't usually help sx.    Patient Active Problem List   Diagnosis Date Noted   Metabolic syndrome 08/18/2021   Establishing care with new doctor, encounter for 06/11/2021   Urinary tract infection without hematuria 06/11/2021   Vitamin D deficiency 06/11/2021    Past Surgical History:  Procedure Laterality Date   CESAREAN SECTION     x7   CHOLECYSTECTOMY      Family History  Problem Relation Age of Onset   Bladder Cancer Father    Breast cancer Neg  Hx     Social History   Socioeconomic History   Marital status: Single    Spouse name: Not on file   Number of children: Not on file   Years of education: Not on file   Highest education level: Not on file  Occupational History   Not on file  Tobacco Use   Smoking status: Never   Smokeless tobacco: Never  Vaping Use   Vaping Use: Never used  Substance and Sexual Activity   Alcohol use: Never   Drug use: Never   Sexual activity: Not Currently    Birth control/protection: Surgical    Comment: Vasectomy  Other Topics Concern   Not on file  Social History Narrative   Not on file   Social Determinants of Health   Financial Resource Strain: Not on file  Food Insecurity: Not on file  Transportation Needs: Not on file  Physical Activity: Not on file  Stress: Not on file  Social Connections: Not on file  Intimate Partner Violence: Not on file     Current Outpatient Medications:    albuterol (VENTOLIN HFA) 108 (90 Base) MCG/ACT inhaler, Inhale 1 puff into the lungs every 6 (six) hours as needed for wheezing or shortness of breath., Disp: 18 g, Rfl: 3   busPIRone (BUSPAR) 15 MG tablet, TAKE 1 TABLET BY MOUTH 2 TIMES DAILY., Disp: 180 tablet, Rfl: 2   fluconazole (DIFLUCAN) 150 MG tablet, Take  1 tablet (150 mg total) by mouth once for 1 dose., Disp: 1 tablet, Rfl: 0   omeprazole (PRILOSEC) 20 MG capsule, Take 1 capsule (20 mg total) by mouth daily., Disp: 90 capsule, Rfl: 2   ondansetron (ZOFRAN) 8 MG tablet, Take 1 tablet (8 mg total) by mouth every 8 (eight) hours as needed for nausea or vomiting., Disp: 20 tablet, Rfl: 1   phentermine (ADIPEX-P) 37.5 MG tablet, Take 37.5 mg by mouth daily., Disp: , Rfl:    sulfamethoxazole-trimethoprim (BACTRIM DS) 800-160 MG tablet, Take 1 tablet by mouth 2 (two) times daily for 7 days., Disp: 14 tablet, Rfl: 0   SUMAtriptan (IMITREX) 25 MG tablet, TAKE 1 TABLET BY MOUTH EVERY 2 HOURS AS NEEDED MIGRAINE. MAY REPEAT IN 2 HOURS IF  PERSISTS/RECURS., Disp: 10 tablet, Rfl: 1     ROS:  Review of Systems  Constitutional:  Negative for fatigue, fever and unexpected weight change.  Respiratory:  Negative for cough, shortness of breath and wheezing.   Cardiovascular:  Negative for chest pain, palpitations and leg swelling.  Gastrointestinal:  Negative for blood in stool, constipation, diarrhea, nausea and vomiting.  Endocrine: Negative for cold intolerance, heat intolerance and polyuria.  Genitourinary:  Positive for dysuria, frequency and pelvic pain. Negative for dyspareunia, flank pain, genital sores, hematuria, menstrual problem, urgency, vaginal bleeding, vaginal discharge and vaginal pain.  Musculoskeletal:  Positive for back pain. Negative for joint swelling and myalgias.  Skin:  Negative for rash.  Neurological:  Negative for dizziness, syncope, light-headedness, numbness and headaches.  Hematological:  Negative for adenopathy.  Psychiatric/Behavioral:  Negative for agitation, confusion, sleep disturbance and suicidal ideas. The patient is not nervous/anxious.    BREAST: No symptoms    Objective: BP 120/82   Ht 5\' 4"  (1.626 m)   Wt 211 lb (95.7 kg)   LMP 05/01/2022 (Approximate)   BMI 36.22 kg/m    Physical Exam Constitutional:      Appearance: She is well-developed.  Genitourinary:     Vulva normal.     Right Labia: No rash, tenderness or lesions.    Left Labia: No tenderness, lesions or rash.    No vaginal discharge, erythema or tenderness.      Right Adnexa: not tender and no mass present.    Left Adnexa: not tender and no mass present.    No cervical friability or polyp.     Uterus is not enlarged or tender.  Breasts:    Right: No mass, nipple discharge, skin change or tenderness.     Left: No mass, nipple discharge, skin change or tenderness.  Neck:     Thyroid: No thyromegaly.  Cardiovascular:     Rate and Rhythm: Normal rate and regular rhythm.     Heart sounds: Normal heart sounds.  No murmur heard. Pulmonary:     Effort: Pulmonary effort is normal.     Breath sounds: Normal breath sounds.  Abdominal:     Palpations: Abdomen is soft.     Tenderness: There is no abdominal tenderness. There is no guarding or rebound.  Musculoskeletal:        General: Normal range of motion.     Cervical back: Normal range of motion.  Lymphadenopathy:     Cervical: No cervical adenopathy.  Neurological:     General: No focal deficit present.     Mental Status: She is alert and oriented to person, place, and time.     Cranial Nerves: No cranial nerve deficit.  Skin:  General: Skin is warm and dry.  Psychiatric:        Mood and Affect: Mood normal.        Behavior: Behavior normal.        Thought Content: Thought content normal.        Judgment: Judgment normal.  Vitals reviewed.     Results: Results for orders placed or performed in visit on 05/11/22 (from the past 24 hour(s))  POCT Urinalysis Dipstick     Status: Abnormal   Collection Time: 05/11/22  2:21 PM  Result Value Ref Range   Color, UA yellow    Clarity, UA clear    Glucose, UA Negative Negative   Bilirubin, UA neg    Ketones, UA pos    Spec Grav, UA 1.020 1.010 - 1.025   Blood, UA small    pH, UA 6.0 5.0 - 8.0   Protein, UA Negative Negative   Urobilinogen, UA     Nitrite, UA pos    Leukocytes, UA Negative Negative   Appearance     Odor      Assessment/Plan:  Encounter for annual routine gynecological examination  Cervical cancer screening - Plan: Cytology - PAP  Screening for HPV (human papillomavirus) - Plan: Cytology - PAP  Cervical high risk HPV (human papillomavirus) test positive; repeat pap today. If abn, will do colpo.  Encounter for screening mammogram for malignant neoplasm of breast - Plan: MM 3D SCREEN BREAST BILATERAL; pt to schedule mammo  Screening for colon cancer - Plan: Cologuard; colonoscopy/cologuard discussed. Pt elects cologuard. Ref sent. Will f/u with results.  UTI  symptoms - Plan: sulfamethoxazole-trimethoprim (BACTRIM DS) 800-160 MG tablet, fluconazole (DIFLUCAN) 150 MG tablet, POCT Urinalysis Dipstick, Urine Culture; pos sx and UA, Rx bactrim (and diflucan prn yeast vag with abx use). Check C&S. If neg, will refer to urology. Question IC if neg.    Meds ordered this encounter  Medications   sulfamethoxazole-trimethoprim (BACTRIM DS) 800-160 MG tablet    Sig: Take 1 tablet by mouth 2 (two) times daily for 7 days.    Dispense:  14 tablet    Refill:  0    Order Specific Question:   Supervising Provider    Answer:   Hildred Laser [[id number]]   fluconazole (DIFLUCAN) 150 MG tablet    Sig: Take 1 tablet (150 mg total) by mouth once for 1 dose.    Dispense:  1 tablet    Refill:  0    Order Specific Question:   Supervising Provider    Answer:   Waymon Budge            GYN counsel breast self exam, mammography screening, adequate intake of calcium and vitamin D, diet and exercise    F/U  Return in about 1 year (around 05/12/2023).  Joselyn Edling B. Jarmal Lewelling, PA-C 05/11/2022 2:23 PM

## 2022-05-11 ENCOUNTER — Ambulatory Visit (INDEPENDENT_AMBULATORY_CARE_PROVIDER_SITE_OTHER): Payer: BC Managed Care – PPO | Admitting: Obstetrics and Gynecology

## 2022-05-11 ENCOUNTER — Encounter: Payer: Self-pay | Admitting: Obstetrics and Gynecology

## 2022-05-11 ENCOUNTER — Other Ambulatory Visit (HOSPITAL_COMMUNITY)
Admission: RE | Admit: 2022-05-11 | Discharge: 2022-05-11 | Disposition: A | Discharge status: Home / Self Care | Payer: BC Managed Care – PPO | Source: Ambulatory Visit | Attending: Obstetrics and Gynecology | Admitting: Obstetrics and Gynecology

## 2022-05-11 VITALS — BP 120/82 | Ht 64.0 in | Wt 211.0 lb

## 2022-05-11 DIAGNOSIS — Z1231 Encounter for screening mammogram for malignant neoplasm of breast: Secondary | ICD-10-CM

## 2022-05-11 DIAGNOSIS — R87810 Cervical high risk human papillomavirus (HPV) DNA test positive: Secondary | ICD-10-CM | POA: Diagnosis not present

## 2022-05-11 DIAGNOSIS — Z124 Encounter for screening for malignant neoplasm of cervix: Secondary | ICD-10-CM

## 2022-05-11 DIAGNOSIS — R399 Unspecified symptoms and signs involving the genitourinary system: Secondary | ICD-10-CM

## 2022-05-11 DIAGNOSIS — Z1211 Encounter for screening for malignant neoplasm of colon: Secondary | ICD-10-CM

## 2022-05-11 DIAGNOSIS — Z1151 Encounter for screening for human papillomavirus (HPV): Secondary | ICD-10-CM | POA: Insufficient documentation

## 2022-05-11 DIAGNOSIS — Z01419 Encounter for gynecological examination (general) (routine) without abnormal findings: Secondary | ICD-10-CM | POA: Diagnosis not present

## 2022-05-11 LAB — POCT URINALYSIS DIPSTICK
Bilirubin, UA: NEGATIVE
Glucose, UA: NEGATIVE
Ketones, UA: POSITIVE
Leukocytes, UA: NEGATIVE
Nitrite, UA: POSITIVE
Protein, UA: NEGATIVE
Spec Grav, UA: 1.02 (ref 1.010–1.025)
pH, UA: 6 (ref 5.0–8.0)

## 2022-05-11 MED ORDER — FLUCONAZOLE 150 MG PO TABS: 150.0000 mg | ORAL_TABLET | Freq: Once | ORAL | 0 refills | Status: AC

## 2022-05-11 MED ORDER — SULFAMETHOXAZOLE-TRIMETHOPRIM 800-160 MG PO TABS: 1.0000 | ORAL_TABLET | Freq: Two times a day (BID) | ORAL | 0 refills | Status: AC

## 2022-05-11 NOTE — Patient Instructions (Signed)
I value your feedback and you entrusting us with your care. If you get a [facility] patient survey, I would appreciate you taking the time to let us know about your experience today. Thank you! ? ? ?

## 2022-05-13 DIAGNOSIS — F4310 Post-traumatic stress disorder, unspecified: Secondary | ICD-10-CM | POA: Diagnosis not present

## 2022-05-13 LAB — URINE CULTURE: Organism ID, Bacteria: NO GROWTH

## 2022-05-13 NOTE — Addendum Note (Signed)
Addended by: Ardeth Perfect B on: [id number] 05:34 PM   Modules accepted: Orders

## 2022-05-14 LAB — CYTOLOGY - PAP
Adequacy: ABSENT
Comment: NEGATIVE
Diagnosis: UNDETERMINED — AB
High risk HPV: NEGATIVE

## 2022-05-17 ENCOUNTER — Telehealth: Payer: Self-pay

## 2022-05-17 MED ORDER — FLUCONAZOLE 150 MG PO TABS: 150.0000 mg | ORAL_TABLET | Freq: Once | ORAL | 0 refills | Status: AC

## 2022-05-17 NOTE — Telephone Encounter (Signed)
Patient contacted office requesting medication to treat yeast infection, patient states that Elmo Putt recently prescribed antibiotic to treat UTI and patient states that she normally develops yeast infection when on antibiotics. Patient was advised of recent pap results and urine culture. Patient states that she noticed that pap from last year showed high risk HPV and that results from this year showed HPV negative. Patient inquired to know if HPV disappears on its own? Patient states that she was unsure why it has showed positive and now negative and wants to know if this is something to be concerned about? KW

## 2022-05-17 NOTE — Telephone Encounter (Signed)
Pt aware.

## 2022-05-17 NOTE — Telephone Encounter (Signed)
HPV is a virus and normally your immune system fights is off in 1-2 years. It's a good thing it's gone.

## 2022-05-24 DIAGNOSIS — F4310 Post-traumatic stress disorder, unspecified: Secondary | ICD-10-CM | POA: Diagnosis not present

## 2022-06-14 DIAGNOSIS — F4310 Post-traumatic stress disorder, unspecified: Secondary | ICD-10-CM | POA: Diagnosis not present

## 2022-06-14 DIAGNOSIS — F4323 Adjustment disorder with mixed anxiety and depressed mood: Secondary | ICD-10-CM | POA: Diagnosis not present

## 2022-06-14 DIAGNOSIS — Z635 Disruption of family by separation and divorce: Secondary | ICD-10-CM | POA: Diagnosis not present

## 2022-06-28 DIAGNOSIS — Z635 Disruption of family by separation and divorce: Secondary | ICD-10-CM | POA: Diagnosis not present

## 2022-06-28 DIAGNOSIS — F4323 Adjustment disorder with mixed anxiety and depressed mood: Secondary | ICD-10-CM | POA: Diagnosis not present

## 2022-06-28 DIAGNOSIS — F4310 Post-traumatic stress disorder, unspecified: Secondary | ICD-10-CM | POA: Diagnosis not present

## 2022-07-02 ENCOUNTER — Other Ambulatory Visit: Payer: Self-pay | Admitting: Nurse Practitioner

## 2022-07-12 ENCOUNTER — Ambulatory Visit: Payer: BC Managed Care – PPO | Admitting: Urology

## 2022-07-12 DIAGNOSIS — F4323 Adjustment disorder with mixed anxiety and depressed mood: Secondary | ICD-10-CM | POA: Diagnosis not present

## 2022-07-12 DIAGNOSIS — Z635 Disruption of family by separation and divorce: Secondary | ICD-10-CM | POA: Diagnosis not present

## 2022-07-12 DIAGNOSIS — F4310 Post-traumatic stress disorder, unspecified: Secondary | ICD-10-CM | POA: Diagnosis not present

## 2022-07-26 DIAGNOSIS — F4323 Adjustment disorder with mixed anxiety and depressed mood: Secondary | ICD-10-CM | POA: Diagnosis not present

## 2022-07-26 DIAGNOSIS — Z635 Disruption of family by separation and divorce: Secondary | ICD-10-CM | POA: Diagnosis not present

## 2022-07-26 DIAGNOSIS — F4310 Post-traumatic stress disorder, unspecified: Secondary | ICD-10-CM | POA: Diagnosis not present

## 2022-08-09 DIAGNOSIS — Z635 Disruption of family by separation and divorce: Secondary | ICD-10-CM | POA: Diagnosis not present

## 2022-08-09 DIAGNOSIS — F4323 Adjustment disorder with mixed anxiety and depressed mood: Secondary | ICD-10-CM | POA: Diagnosis not present

## 2022-08-09 DIAGNOSIS — F4310 Post-traumatic stress disorder, unspecified: Secondary | ICD-10-CM | POA: Diagnosis not present

## 2022-09-06 ENCOUNTER — Ambulatory Visit (INDEPENDENT_AMBULATORY_CARE_PROVIDER_SITE_OTHER): Payer: BC Managed Care – PPO | Admitting: Urology

## 2022-09-06 VITALS — BP 112/75 | HR 73 | Ht 64.0 in | Wt 218.0 lb

## 2022-09-06 DIAGNOSIS — R350 Frequency of micturition: Secondary | ICD-10-CM | POA: Diagnosis not present

## 2022-09-06 DIAGNOSIS — N3020 Other chronic cystitis without hematuria: Secondary | ICD-10-CM

## 2022-09-06 DIAGNOSIS — Z635 Disruption of family by separation and divorce: Secondary | ICD-10-CM | POA: Diagnosis not present

## 2022-09-06 DIAGNOSIS — F4323 Adjustment disorder with mixed anxiety and depressed mood: Secondary | ICD-10-CM | POA: Diagnosis not present

## 2022-09-06 DIAGNOSIS — F4310 Post-traumatic stress disorder, unspecified: Secondary | ICD-10-CM | POA: Diagnosis not present

## 2022-09-06 MED ORDER — NITROFURANTOIN MACROCRYSTAL 100 MG PO CAPS: 100.0000 mg | ORAL_CAPSULE | Freq: Every day | ORAL | 11 refills | Status: DC

## 2022-09-06 NOTE — Patient Instructions (Signed)

## 2022-09-06 NOTE — Progress Notes (Signed)
09/06/2022 2:16 PM   Susan Charles 07-22-1975 [id number]  Referring provider: Corky Downs, MD [address] [place],  [place] [address]  Chief Complaint  Patient presents with   Establish Care    HPI: I was consulted to assess the patient's recurrent bladder infections.  She gets frequency suprapubic pain and low back pain.  Says her symptoms respond favorably to antibiotics.  Last 2 urine cultures in the medical record were negative  When she is not infected she voids every 1-2 hours and gets up to 3 times at night.  Flow was good.  She can double void a modest amount.  She has low volume stress and urge incontinence not wearing a pad  Has not had a hysterectomy  No history of bladder surgery kidney stones or neurologic issues.  Has alternating diarrhea and constipation.   PMH: Past Medical History:  Diagnosis Date   Asthma    Migraine     Surgical History: Past Surgical History:  Procedure Laterality Date   CESAREAN SECTION     x7   CHOLECYSTECTOMY      Home Medications:  Allergies as of 09/06/2022   No Known Allergies      Medication List        Accurate as of September 06, 2022  2:16 PM. If you have any questions, ask your nurse or doctor.          albuterol 108 (90 Base) MCG/ACT inhaler Commonly known as: VENTOLIN HFA Inhale 1 puff into the lungs every 6 (six) hours as needed for wheezing or shortness of breath.   busPIRone 15 MG tablet Commonly known as: BUSPAR TAKE 1 TABLET BY MOUTH 2 TIMES DAILY.   omeprazole 20 MG capsule Commonly known as: PRILOSEC Take 1 capsule (20 mg total) by mouth daily.   ondansetron 8 MG tablet Commonly known as: ZOFRAN Take 1 tablet (8 mg total) by mouth every 8 (eight) hours as needed for nausea or vomiting.   phentermine 37.5 MG tablet Commonly known as: ADIPEX-P Take 37.5 mg by mouth daily.   SUMAtriptan 25 MG tablet Commonly known as: IMITREX TAKE 1 TABLET BY MOUTH EVERY 2 HOURS AS NEEDED MIGRAINE. MAY  REPEAT IN 2 HOURS IF PERSISTS/RECURS.        Allergies: No Known Allergies  Family History: Family History  Problem Relation Age of Onset   Bladder Cancer Father    Breast cancer Neg Hx     Social History:  reports that she has never smoked. She has never used smokeless tobacco. She reports that she does not drink alcohol and does not use drugs.  ROS:                                        Physical Exam: There were no vitals taken for this visit.  Constitutional:  Alert and oriented, No acute distress. HEENT: [place] AT, moist mucus membranes.  Trachea midline, no masses.   Laboratory Data: Lab Results  Component Value Date   WBC 8.2 08/19/2021   HGB 13.4 08/19/2021   HCT 41.7 08/19/2021   MCV 97.2 08/19/2021   PLT 305 08/19/2021    Lab Results  Component Value Date   CREATININE 0.68 08/19/2021    No results found for: "PSA"  No results found for: "TESTOSTERONE"  No results found for: "HGBA1C"  Urinalysis    Component Value Date/Time   BILIRUBINUR  neg 05/11/2022 1421   KETONESUR negative 10/27/2020 1042   PROTEINUR Negative 05/11/2022 1421   UROBILINOGEN 0.2 08/18/2021 1135   NITRITE pos 05/11/2022 1421   LEUKOCYTESUR Negative 05/11/2022 1421    Pertinent Imaging: Urine reviewed.  Urine sent for culture.  Chart reviewed  Assessment & Plan: Patient may be having recurrent bladder infections.  Pathophysiology discussed.  Renal ultrasound ordered and call if abnormal.  Return on Macrodantin 100 mg 30 x 11 for cystoscopy.  Get urine cultures for flareup of symptoms.  I do not think she has interstitial cystitis.  Prone to yeast infections and this was discussed.  Probiotics discussed.    1. Urinary frequency  - Urinalysis, Complete   No follow-ups on file.  Martina Sinner, MD  [facility] [address] [place], [place] [address] [phone]

## 2022-09-07 LAB — URINALYSIS, COMPLETE
Bilirubin, UA: NEGATIVE
Glucose, UA: NEGATIVE
Ketones, UA: NEGATIVE
Leukocytes,UA: NEGATIVE
Nitrite, UA: NEGATIVE
Protein,UA: NEGATIVE
Specific Gravity, UA: 1.02 (ref 1.005–1.030)
Urobilinogen, Ur: 0.2 mg/dL (ref 0.2–1.0)
pH, UA: 6.5 (ref 5.0–7.5)

## 2022-09-07 LAB — MICROSCOPIC EXAMINATION

## 2022-09-09 LAB — CULTURE, URINE COMPREHENSIVE

## 2022-10-12 DIAGNOSIS — Z635 Disruption of family by separation and divorce: Secondary | ICD-10-CM | POA: Diagnosis not present

## 2022-10-12 DIAGNOSIS — F4323 Adjustment disorder with mixed anxiety and depressed mood: Secondary | ICD-10-CM | POA: Diagnosis not present

## 2022-10-12 DIAGNOSIS — F4310 Post-traumatic stress disorder, unspecified: Secondary | ICD-10-CM | POA: Diagnosis not present

## 2022-10-18 ENCOUNTER — Other Ambulatory Visit: Payer: BC Managed Care – PPO | Admitting: Urology

## 2022-11-01 ENCOUNTER — Ambulatory Visit: Payer: BC Managed Care – PPO | Admitting: Urology

## 2022-11-01 VITALS — BP 128/62 | HR 80

## 2022-11-01 DIAGNOSIS — N3020 Other chronic cystitis without hematuria: Secondary | ICD-10-CM

## 2022-11-01 DIAGNOSIS — M545 Low back pain, unspecified: Secondary | ICD-10-CM

## 2022-11-01 DIAGNOSIS — R350 Frequency of micturition: Secondary | ICD-10-CM | POA: Diagnosis not present

## 2022-11-01 DIAGNOSIS — R102 Pelvic and perineal pain: Secondary | ICD-10-CM

## 2022-11-01 LAB — URINALYSIS, COMPLETE

## 2022-11-01 LAB — MICROSCOPIC EXAMINATION

## 2022-11-01 MED ORDER — TRIMETHOPRIM 100 MG PO TABS: 100.0000 mg | ORAL_TABLET | Freq: Every day | ORAL | 11 refills | Status: AC

## 2022-11-01 NOTE — Progress Notes (Signed)
11/01/2022 8:31 AM   Susan Charles 21-Feb-1976 [id number]  Referring provider: Corky Downs, MD [address] [place],  [place] [address]  Chief Complaint  Patient presents with   Cysto    HPI: I was consulted to assess the patient's recurrent bladder infections.  She gets frequency suprapubic pain and low back pain.  Says her symptoms respond favorably to antibiotics.  Last 2 urine cultures in the medical record were negative   When she is not infected she voids every 1-2 hours and gets up to 3 times at night.  Flow was good.  She can double void a modest amount.  She has low volume stress and urge incontinence not wearing a pad   Has not had a hysterectomy    Patient may be having recurrent bladder infections. Pathophysiology discussed. Renal ultrasound ordered and call if abnormal. Return on Macrodantin 100 mg 30 x 11 for cystoscopy. Get urine cultures for flareup of symptoms. I do not think she has interstitial cystitis. Prone to yeast infections and this was discussed. Probiotics discussed.   Today Last culture negative.  Ultrasound scheduled for November 03, 2022 Patient stopped the once a day antibiotic because she said it was not really helping.  She was still getting back pain and burning.  In the last week symptoms are worse.  She is having more frequency back pain and burning On pelvic examination patient had grade 2 hypermobility the bladder neck and no stress incontinence.  She was a bit nervous but did well cystoscopy Cystoscopy: Patient underwent flexible cystoscopy.  Bladder mucosa and trigone were normal.  No cloudy urine.  No erythema.  No carcinoma.  She felt more uncomfortable in the suprapubic area with bladder filling but again was a bit nervous  PMH: Past Medical History:  Diagnosis Date   Asthma    Migraine     Surgical History: Past Surgical History:  Procedure Laterality Date   CESAREAN SECTION     x7   CHOLECYSTECTOMY      Home Medications:   Allergies as of 11/01/2022   No Known Allergies      Medication List        Accurate as of November 01, 2022  8:31 AM. If you have any questions, ask your nurse or doctor.          albuterol 108 (90 Base) MCG/ACT inhaler Commonly known as: VENTOLIN HFA Inhale 1 puff into the lungs every 6 (six) hours as needed for wheezing or shortness of breath.   busPIRone 15 MG tablet Commonly known as: BUSPAR TAKE 1 TABLET BY MOUTH 2 TIMES DAILY.   nitrofurantoin 100 MG capsule Commonly known as: MACRODANTIN Take 1 capsule (100 mg total) by mouth daily.   omeprazole 20 MG capsule Commonly known as: PRILOSEC Take 1 capsule (20 mg total) by mouth daily.   ondansetron 8 MG tablet Commonly known as: ZOFRAN Take 1 tablet (8 mg total) by mouth every 8 (eight) hours as needed for nausea or vomiting.   phentermine 37.5 MG tablet Commonly known as: ADIPEX-P Take 37.5 mg by mouth daily.   SUMAtriptan 25 MG tablet Commonly known as: IMITREX TAKE 1 TABLET BY MOUTH EVERY 2 HOURS AS NEEDED MIGRAINE. MAY REPEAT IN 2 HOURS IF PERSISTS/RECURS.        Allergies: No Known Allergies  Family History: Family History  Problem Relation Age of Onset   Bladder Cancer Father    Breast cancer Neg Hx     Social History:  reports that she has never smoked. She has never used smokeless tobacco. She reports that she does not drink alcohol and does not use drugs.  ROS:                                        Physical Exam: BP 128/62   Pulse 80   Constitutional:  Alert and oriented, No acute distress. HEENT: [place] AT, moist mucus membranes.  Trachea midline, no masses.   Laboratory Data: Lab Results  Component Value Date   WBC 8.2 08/19/2021   HGB 13.4 08/19/2021   HCT 41.7 08/19/2021   MCV 97.2 08/19/2021   PLT 305 08/19/2021    Lab Results  Component Value Date   CREATININE 0.68 08/19/2021    No results found for: "PSA"  No results found for:  "TESTOSTERONE"  No results found for: "HGBA1C"  Urinalysis    Component Value Date/Time   APPEARANCEUR Clear 09/06/2022 1430   GLUCOSEU Negative 09/06/2022 1430   BILIRUBINUR Negative 09/06/2022 1430   KETONESUR negative 10/27/2020 1042   PROTEINUR Negative 09/06/2022 1430   UROBILINOGEN 0.2 08/18/2021 1135   NITRITE Negative 09/06/2022 1430   LEUKOCYTESUR Negative 09/06/2022 1430    Pertinent Imaging: Urine reviewed and sent for culture  Assessment & Plan: Patient understands that she could be having recurrent bladder infections versus interstitial cystitis.  I am little bit more concerned that she could have IC.  I reviewed a hydrodistention in detail with her with my usual template.  She will come back in 6 or 7 weeks on trimethoprim.  Call if culture positive.  Call if ultrasound abnormal.  Will proceed with hydrodistention if needed in future pending progress  1. Urinary frequency  - Urinalysis, Complete   No follow-ups on file.  Martina Sinner, MD  [facility] [address] [place], [place] [address] [phone]

## 2022-11-03 ENCOUNTER — Ambulatory Visit
Admission: RE | Admit: 2022-11-03 | Discharge: 2022-11-03 | Disposition: A | Discharge status: Home / Self Care | Payer: BC Managed Care – PPO | Source: Ambulatory Visit | Attending: Urology | Admitting: Urology

## 2022-11-03 DIAGNOSIS — N3090 Cystitis, unspecified without hematuria: Secondary | ICD-10-CM | POA: Diagnosis not present

## 2022-11-03 DIAGNOSIS — R350 Frequency of micturition: Secondary | ICD-10-CM | POA: Diagnosis not present

## 2022-11-03 DIAGNOSIS — N3020 Other chronic cystitis without hematuria: Secondary | ICD-10-CM | POA: Insufficient documentation

## 2022-11-25 DIAGNOSIS — F4310 Post-traumatic stress disorder, unspecified: Secondary | ICD-10-CM | POA: Diagnosis not present

## 2022-11-25 DIAGNOSIS — F4323 Adjustment disorder with mixed anxiety and depressed mood: Secondary | ICD-10-CM | POA: Diagnosis not present

## 2022-11-25 DIAGNOSIS — Z635 Disruption of family by separation and divorce: Secondary | ICD-10-CM | POA: Diagnosis not present

## 2022-12-27 ENCOUNTER — Ambulatory Visit: Payer: BC Managed Care – PPO | Admitting: Urology

## 2023-01-04 NOTE — Progress Notes (Unsigned)
    Corky Downs, MD   No chief complaint on file.   HPI:      [name] is a 47 y.o. [id number] whose LMP was No LMP recorded., presents today for ***    Patient Active Problem List   Diagnosis Date Noted   Metabolic syndrome 08/18/2021   Establishing care with new doctor, encounter for 06/11/2021   Urinary tract infection without hematuria 06/11/2021   Vitamin D deficiency 06/11/2021    Past Surgical History:  Procedure Laterality Date   CESAREAN SECTION     x7   CHOLECYSTECTOMY      Family History  Problem Relation Age of Onset   Bladder Cancer Father    Breast cancer Neg Hx     Social History   Socioeconomic History   Marital status: Single    Spouse name: Not on file   Number of children: Not on file   Years of education: Not on file   Highest education level: Not on file  Occupational History   Not on file  Tobacco Use   Smoking status: Never   Smokeless tobacco: Never  Vaping Use   Vaping status: Never Used  Substance and Sexual Activity   Alcohol use: Never   Drug use: Never   Sexual activity: Not Currently    Birth control/protection: Surgical    Comment: Vasectomy  Other Topics Concern   Not on file  Social History Narrative   Not on file   Social Determinants of Health   Financial Resource Strain: Not on file  Food Insecurity: Not on file  Transportation Needs: Not on file  Physical Activity: Not on file  Stress: Not on file  Social Connections: Not on file  Intimate Partner Violence: Not on file    Outpatient Medications Prior to Visit  Medication Sig Dispense Refill   albuterol (VENTOLIN HFA) 108 (90 Base) MCG/ACT inhaler Inhale 1 puff into the lungs every 6 (six) hours as needed for wheezing or shortness of breath. 18 g 3   busPIRone (BUSPAR) 15 MG tablet TAKE 1 TABLET BY MOUTH 2 TIMES DAILY. 180 tablet 2   omeprazole (PRILOSEC) 20 MG capsule Take 1 capsule (20 mg total) by mouth daily. 90 capsule 2   ondansetron  (ZOFRAN) 8 MG tablet Take 1 tablet (8 mg total) by mouth every 8 (eight) hours as needed for nausea or vomiting. 20 tablet 1   phentermine (ADIPEX-P) 37.5 MG tablet Take 37.5 mg by mouth daily.     SUMAtriptan (IMITREX) 25 MG tablet TAKE 1 TABLET BY MOUTH EVERY 2 HOURS AS NEEDED MIGRAINE. MAY REPEAT IN 2 HOURS IF PERSISTS/RECURS. 10 tablet 1   trimethoprim (TRIMPEX) 100 MG tablet Take 1 tablet (100 mg total) by mouth daily. 30 tablet 11   No facility-administered medications prior to visit.      ROS:  Review of Systems BREAST: No symptoms   OBJECTIVE:   Vitals:  There were no vitals taken for this visit.  Physical Exam  Results: No results found for this or any previous visit (from the past 24 hour(s)).   Assessment/Plan: No diagnosis found.    No orders of the defined types were placed in this encounter.     No follow-ups on file.  Kealani Leckey B. Pleshette Tomasini, PA-C 01/04/2023 5:41 PM

## 2023-01-05 ENCOUNTER — Encounter: Payer: Self-pay | Admitting: Obstetrics and Gynecology

## 2023-01-05 ENCOUNTER — Other Ambulatory Visit: Payer: Self-pay | Admitting: Obstetrics and Gynecology

## 2023-01-05 ENCOUNTER — Ambulatory Visit (INDEPENDENT_AMBULATORY_CARE_PROVIDER_SITE_OTHER): Payer: Medicaid Other | Admitting: Obstetrics and Gynecology

## 2023-01-05 VITALS — BP 120/72 | Ht 65.0 in | Wt 224.0 lb

## 2023-01-05 DIAGNOSIS — F419 Anxiety disorder, unspecified: Secondary | ICD-10-CM

## 2023-01-05 DIAGNOSIS — R110 Nausea: Secondary | ICD-10-CM

## 2023-01-05 MED ORDER — FLUOXETINE HCL 20 MG PO CAPS: 20.0000 mg | ORAL_CAPSULE | Freq: Every day | ORAL | 1 refills | Status: DC

## 2023-01-05 MED ORDER — FLUOXETINE HCL 10 MG PO CAPS: 10.0000 mg | ORAL_CAPSULE | Freq: Every day | ORAL | 0 refills | Status: DC

## 2023-01-05 MED ORDER — ESCITALOPRAM OXALATE 10 MG PO TABS: ORAL_TABLET | ORAL | 1 refills | Status: DC

## 2023-01-05 MED ORDER — LORAZEPAM 0.5 MG PO TABS: 0.5000 mg | ORAL_TABLET | Freq: Two times a day (BID) | ORAL | 0 refills | Status: DC | PRN

## 2023-01-05 NOTE — Progress Notes (Signed)
Rx SSRI changed to prozac due to PA needed for lexapro and paxil per pharm.

## 2023-01-06 MED ORDER — ONDANSETRON HCL 8 MG PO TABS
8.0000 mg | ORAL_TABLET | Freq: Three times a day (TID) | ORAL | 1 refills | Status: AC | PRN
Start: 2023-01-06 — End: ?

## 2023-01-25 ENCOUNTER — Telehealth: Payer: Self-pay | Admitting: Obstetrics and Gynecology

## 2023-01-25 DIAGNOSIS — F419 Anxiety disorder, unspecified: Secondary | ICD-10-CM

## 2023-01-25 MED ORDER — LORAZEPAM 0.5 MG PO TABS
0.5000 mg | ORAL_TABLET | Freq: Two times a day (BID) | ORAL | 0 refills | Status: DC | PRN
Start: 2023-01-25 — End: 2023-02-17

## 2023-01-25 NOTE — Telephone Encounter (Signed)
Rx RF ativan to take sparingly. On prozac for a few wks so far.

## 2023-01-28 ENCOUNTER — Other Ambulatory Visit: Payer: Self-pay | Admitting: Obstetrics and Gynecology

## 2023-01-28 DIAGNOSIS — F419 Anxiety disorder, unspecified: Secondary | ICD-10-CM

## 2023-02-16 NOTE — Progress Notes (Unsigned)
Corky Downs, MD   No chief complaint on file.   HPI:      [name] is a 47 y.o. [id number] whose LMP was No LMP recorded., presents today for anxiety f/u. Pt on prozac 20 mg daily with ativan prn. Tried to do lexapro but PA needed for lexapro and paxil per pharm  GAD=15 PHQ=9 01/05/23 NOTE: worsening anxiety sx. Has been doing buspar BID for 2 yrs (with PCP), now without sx relief for a few months. Has been under increased stress recently and now having panic attacks several times daily, tachycardia and chest pains for several wks. Trouble sleeping since brain won't turn off. NO SI.Trying GABA/ashwaganda for sx. Did zoloft and wellbutrin in distant past. Didn't think zoloft helped, had allergic reaction to wellbutrin. FH anxiety but not treated. Has been seeing therapist for 5 yrs, exercises regularly. Has never done benzo.   Patient Active Problem List   Diagnosis Date Noted   Metabolic syndrome 08/18/2021   Establishing care with new doctor, encounter for 06/11/2021   Urinary tract infection without hematuria 06/11/2021   Vitamin D deficiency 06/11/2021    Past Surgical History:  Procedure Laterality Date   CESAREAN SECTION     x7   CHOLECYSTECTOMY      Family History  Problem Relation Age of Onset   Bladder Cancer Father    Breast cancer Neg Hx     Social History   Socioeconomic History   Marital status: Single    Spouse name: Not on file   Number of children: Not on file   Years of education: Not on file   Highest education level: Not on file  Occupational History   Not on file  Tobacco Use   Smoking status: Never   Smokeless tobacco: Never  Vaping Use   Vaping status: Never Used  Substance and Sexual Activity   Alcohol use: Never   Drug use: Never   Sexual activity: Not Currently    Birth control/protection: Surgical    Comment: Vasectomy  Other Topics Concern   Not on file  Social History Narrative   Not on file   Social Determinants of  Health   Financial Resource Strain: Not on file  Food Insecurity: Not on file  Transportation Needs: Not on file  Physical Activity: Not on file  Stress: Not on file  Social Connections: Not on file  Intimate Partner Violence: Not on file    Outpatient Medications Prior to Visit  Medication Sig Dispense Refill   albuterol (VENTOLIN HFA) 108 (90 Base) MCG/ACT inhaler Inhale 1 puff into the lungs every 6 (six) hours as needed for wheezing or shortness of breath. 18 g 3   busPIRone (BUSPAR) 15 MG tablet TAKE 1 TABLET BY MOUTH 2 TIMES DAILY. 180 tablet 2   FLUoxetine (PROZAC) 10 MG capsule Take 1 capsule (10 mg total) by mouth daily. Then start 20 mg dose daily 7 capsule 0   FLUoxetine (PROZAC) 20 MG capsule Take 1 capsule (20 mg total) by mouth daily. 30 capsule 1   LORazepam (ATIVAN) 0.5 MG tablet Take 1 tablet (0.5 mg total) by mouth 2 (two) times daily as needed for anxiety. 30 tablet 0   omeprazole (PRILOSEC) 20 MG capsule Take 1 capsule (20 mg total) by mouth daily. 90 capsule 2   ondansetron (ZOFRAN) 8 MG tablet Take 1 tablet (8 mg total) by mouth every 8 (eight) hours as needed for nausea or vomiting. 20 tablet 1  phentermine (ADIPEX-P) 37.5 MG tablet Take 37.5 mg by mouth daily. (Patient not taking: Reported on 01/05/2023)     SUMAtriptan (IMITREX) 25 MG tablet TAKE 1 TABLET BY MOUTH EVERY 2 HOURS AS NEEDED MIGRAINE. MAY REPEAT IN 2 HOURS IF PERSISTS/RECURS. 10 tablet 1   trimethoprim (TRIMPEX) 100 MG tablet Take 1 tablet (100 mg total) by mouth daily. 30 tablet 11   No facility-administered medications prior to visit.      ROS:  Review of Systems BREAST: No symptoms   OBJECTIVE:   Vitals:  There were no vitals taken for this visit.  Physical Exam  Results: No results found for this or any previous visit (from the past 24 hour(s)).   Assessment/Plan: No diagnosis found.    No orders of the defined types were placed in this encounter.     No follow-ups on  file.  Ozan Maclay B. Tamyah Cutbirth, PA-C 02/16/2023 4:47 PM

## 2023-02-17 ENCOUNTER — Ambulatory Visit: Payer: Medicaid Other | Admitting: Obstetrics and Gynecology

## 2023-02-17 ENCOUNTER — Encounter: Payer: Self-pay | Admitting: Obstetrics and Gynecology

## 2023-02-17 VITALS — BP 106/70 | HR 82 | Ht 64.0 in | Wt 231.0 lb

## 2023-02-17 DIAGNOSIS — F419 Anxiety disorder, unspecified: Secondary | ICD-10-CM | POA: Diagnosis not present

## 2023-02-17 MED ORDER — FLUOXETINE HCL 40 MG PO CAPS: 40.0000 mg | ORAL_CAPSULE | Freq: Every day | ORAL | 0 refills | Status: DC

## 2023-02-17 MED ORDER — FLUCONAZOLE 150 MG PO TABS: 150.0000 mg | ORAL_TABLET | Freq: Once | ORAL | 1 refills | Status: AC

## 2023-02-17 MED ORDER — LORAZEPAM 0.5 MG PO TABS: 0.5000 mg | ORAL_TABLET | Freq: Two times a day (BID) | ORAL | 0 refills | Status: DC | PRN

## 2023-02-17 NOTE — Patient Instructions (Signed)
I value your feedback and you entrusting us with your care. If you get a [facility] patient survey, I would appreciate you taking the time to let us know about your experience today. Thank you! ? ? ?

## 2023-03-06 IMAGING — MG MM DIGITAL SCREENING BILAT W/ TOMO AND CAD
8 series · 8 of 24 positions shown · non-contrast
Comparison: Previous exam(s).

CLINICAL DATA: Screening.

EXAM:
DIGITAL SCREENING BILATERAL MAMMOGRAM WITH TOMOSYNTHESIS AND CAD
TECHNIQUE: Bilateral screening digital craniocaudal and mediolateral oblique
mammograms were obtained. Bilateral screening digital breast
tomosynthesis was performed. The images were evaluated with
computer-aided detection.

[L MLO synth-2D]
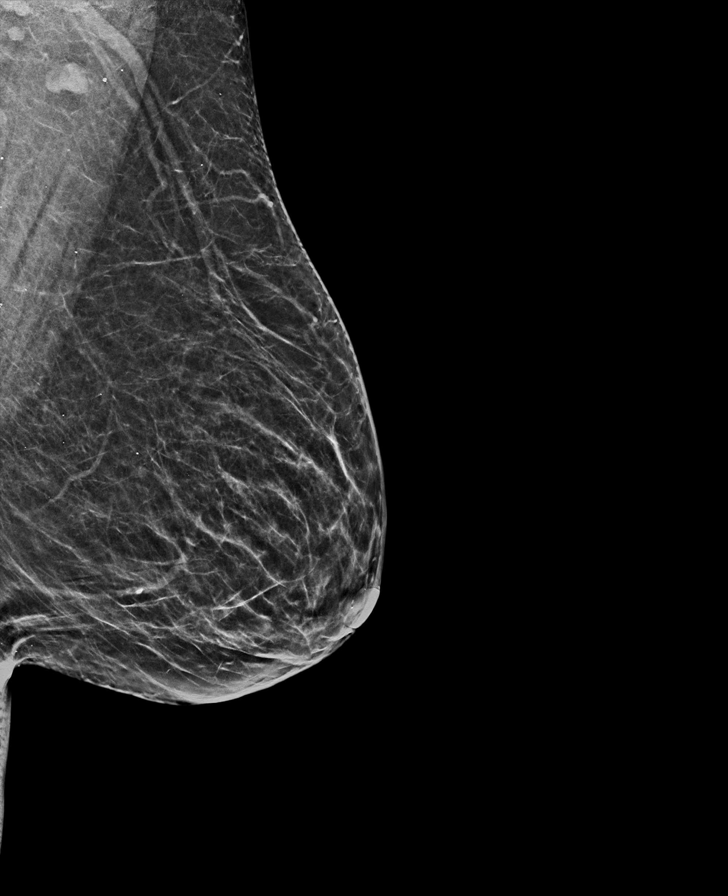

[R CC synth-2D]
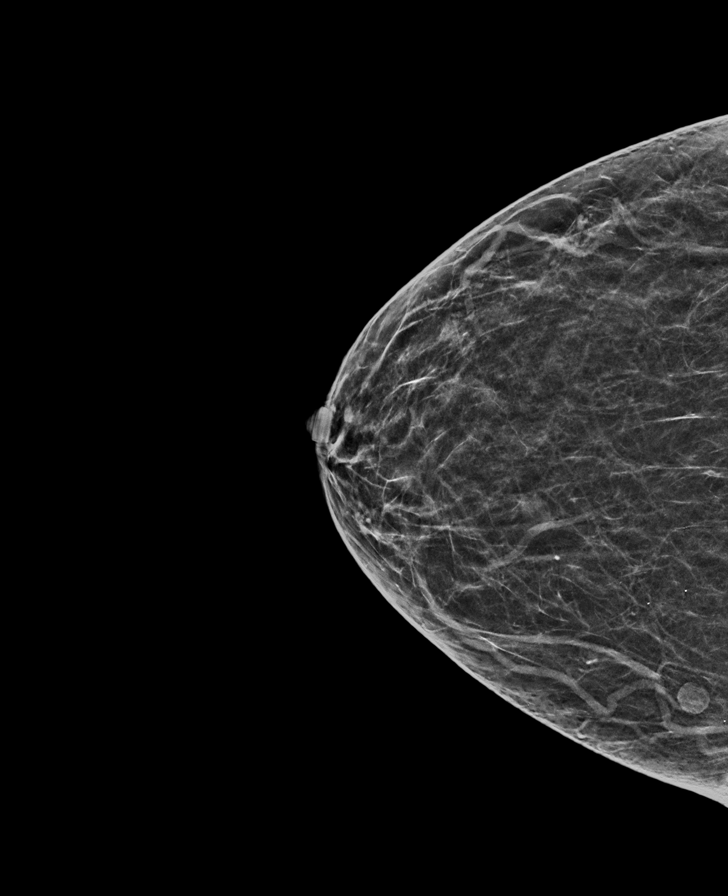

[R MLO synth-2D]
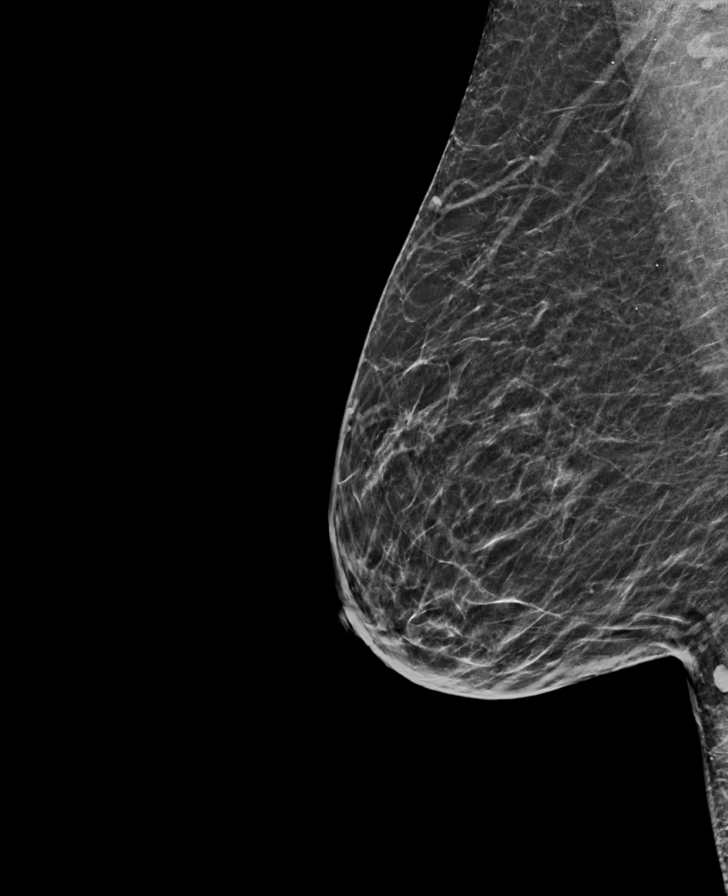

[L CC synth-2D]
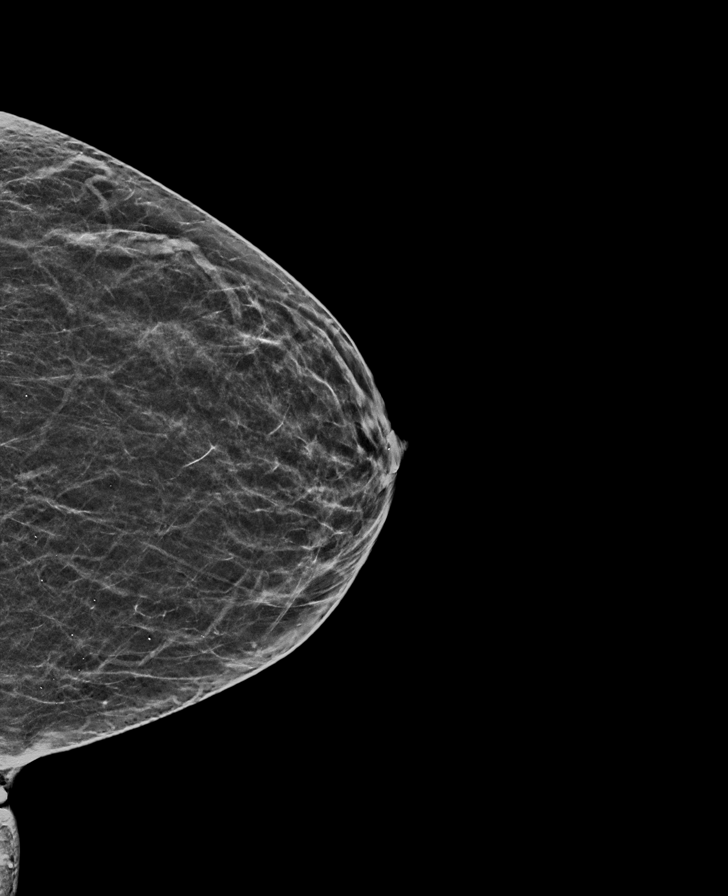

[R CC tomo · tomo slice 27/52.0]
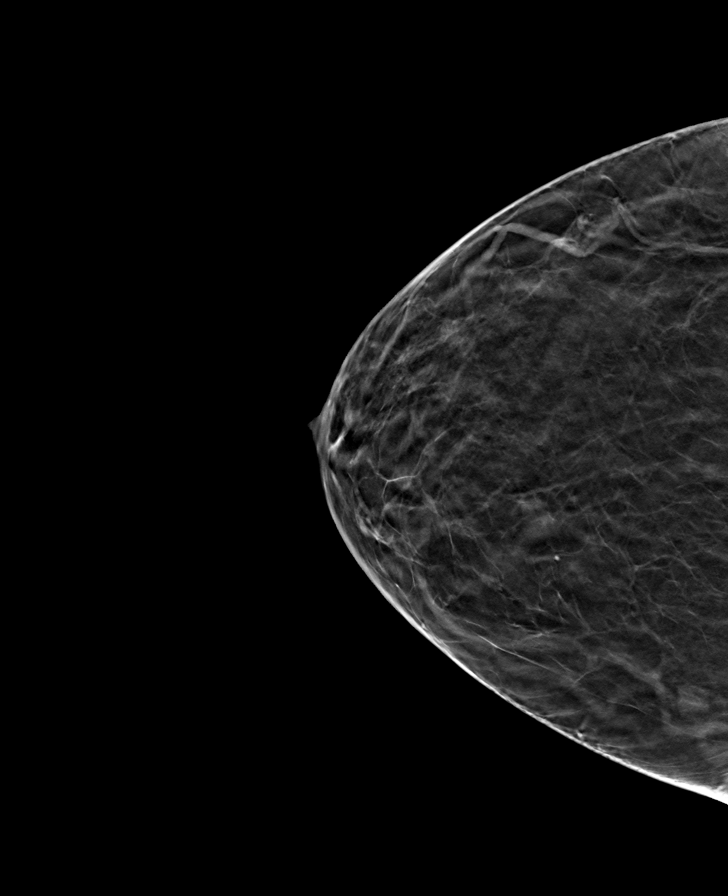

[L CC tomo · tomo slice 27/53.0]
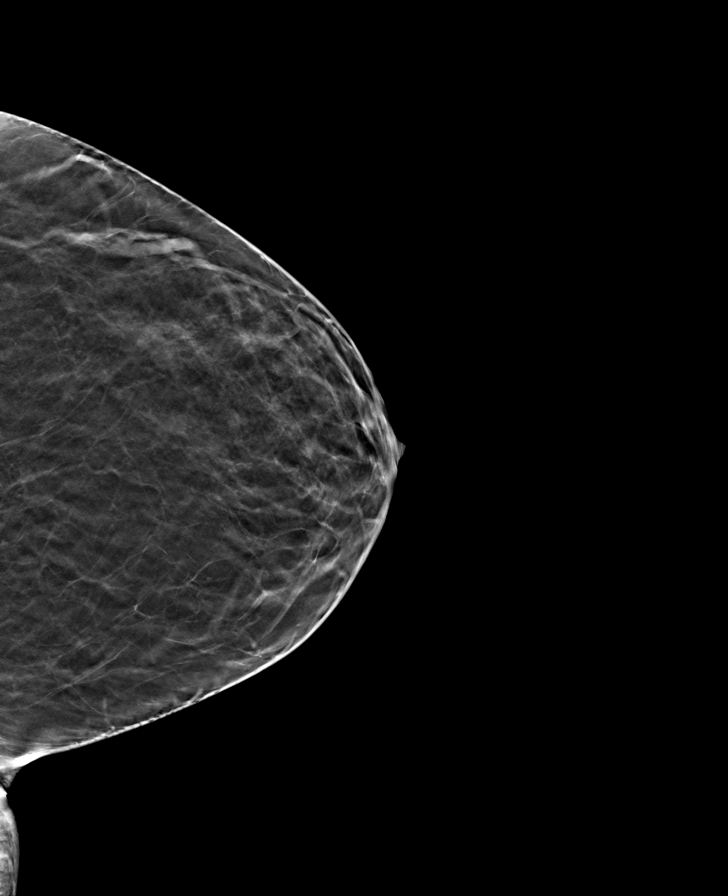

[L MLO tomo · tomo slice 31/60.0]
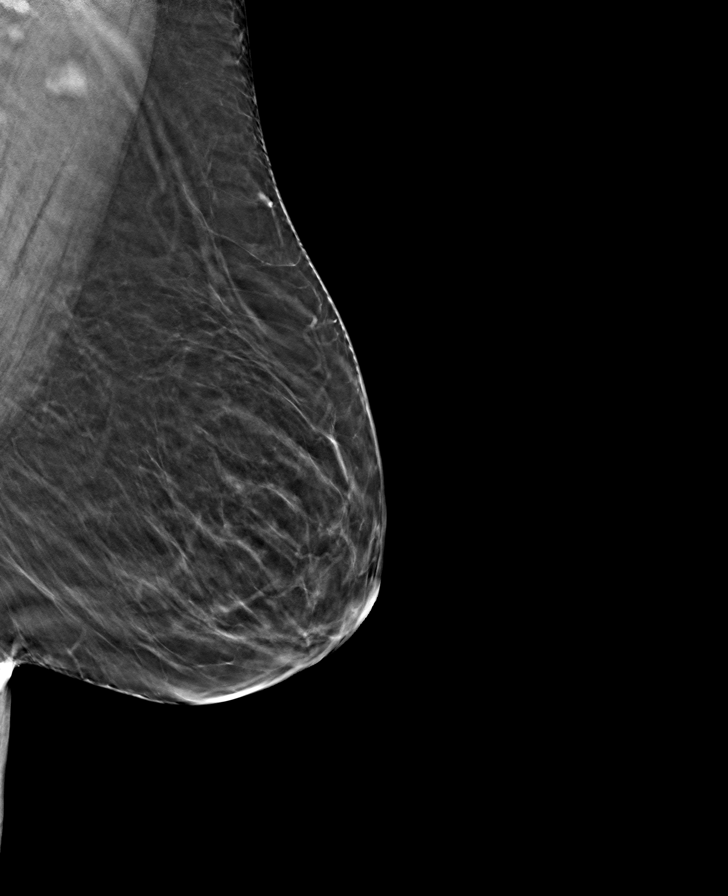

[R MLO tomo · tomo slice 27/54.0]
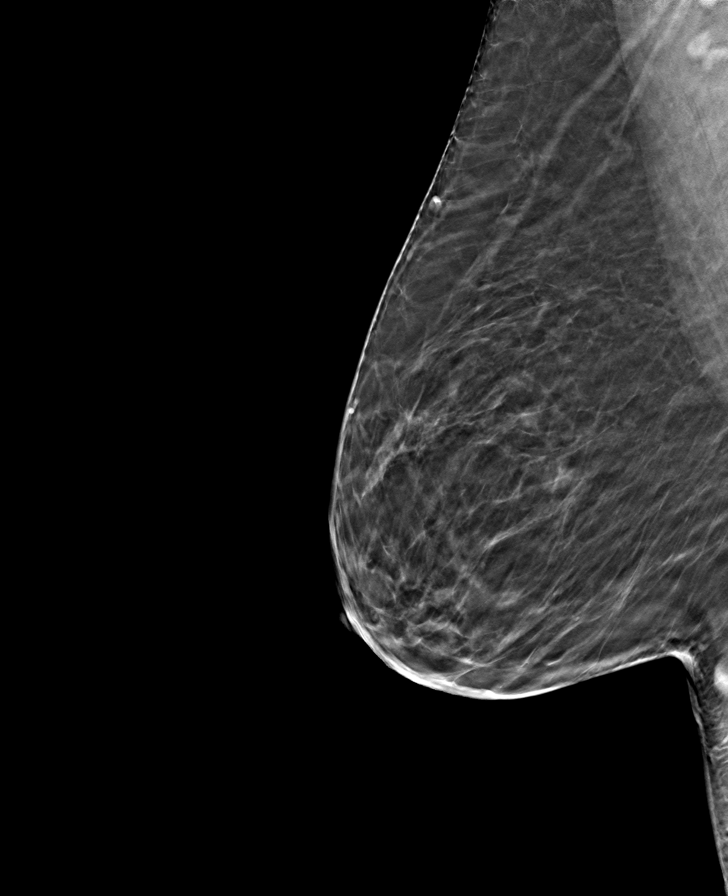

[8 of 24 positions shown; findings below may reference images not displayed]

ACR Breast Density Category b: There are scattered areas of
fibroglandular density.
FINDINGS: There are no findings suspicious for malignancy.
IMPRESSION: No mammographic evidence of malignancy. A result letter of this
screening mammogram will be mailed directly to the patient.

RECOMMENDATION:
Screening mammogram in one year. (Code:51-O-LD2)

BI-RADS CATEGORY  1: Negative.

## 2023-03-15 ENCOUNTER — Encounter: Payer: Self-pay | Admitting: Obstetrics and Gynecology

## 2023-03-15 DIAGNOSIS — F419 Anxiety disorder, unspecified: Secondary | ICD-10-CM

## 2023-03-16 MED ORDER — FLUOXETINE HCL 10 MG PO CAPS: ORAL_CAPSULE | ORAL | 0 refills | Status: DC

## 2023-03-16 MED ORDER — VENLAFAXINE HCL ER 37.5 MG PO CP24: 37.5000 mg | ORAL_CAPSULE | Freq: Every day | ORAL | 0 refills | Status: DC

## 2023-03-16 MED ORDER — VENLAFAXINE HCL ER 75 MG PO CP24: 75.0000 mg | ORAL_CAPSULE | Freq: Every day | ORAL | 0 refills | Status: DC

## 2023-03-21 ENCOUNTER — Other Ambulatory Visit: Payer: Self-pay | Admitting: Obstetrics and Gynecology

## 2023-03-21 DIAGNOSIS — F419 Anxiety disorder, unspecified: Secondary | ICD-10-CM

## 2023-03-21 MED ORDER — LORAZEPAM 0.5 MG PO TABS: 0.5000 mg | ORAL_TABLET | Freq: Two times a day (BID) | ORAL | 0 refills | Status: DC | PRN

## 2023-03-21 MED ORDER — FLUCONAZOLE 150 MG PO TABS: 150.0000 mg | ORAL_TABLET | Freq: Once | ORAL | 0 refills | Status: AC

## 2023-03-21 NOTE — Progress Notes (Signed)
Rx RF lorazepam prn anxiety during change in meds. Rx diflucan prn yeast vag sx.

## 2023-04-25 ENCOUNTER — Other Ambulatory Visit: Payer: Self-pay | Admitting: Obstetrics and Gynecology

## 2023-04-25 DIAGNOSIS — F419 Anxiety disorder, unspecified: Secondary | ICD-10-CM

## 2023-05-06 ENCOUNTER — Other Ambulatory Visit: Payer: Self-pay | Admitting: Obstetrics and Gynecology

## 2023-05-06 DIAGNOSIS — F419 Anxiety disorder, unspecified: Secondary | ICD-10-CM

## 2023-05-07 NOTE — Telephone Encounter (Signed)
 Pls call pt to do GAD/PHQ. If pt doing well, I'll send in RF. Should be tapering down on lorazepam . Thx.

## 2023-05-10 MED ORDER — LORAZEPAM 0.5 MG PO TABS: 0.5000 mg | ORAL_TABLET | Freq: Two times a day (BID) | ORAL | 0 refills | Status: DC | PRN

## 2023-05-10 MED ORDER — VENLAFAXINE HCL ER 150 MG PO CP24: 150.0000 mg | ORAL_CAPSULE | Freq: Every day | ORAL | 0 refills | Status: DC

## 2023-05-10 NOTE — Telephone Encounter (Signed)
TRIAGE VOICEMAIL: Patient returning Grecia's call.

## 2023-05-10 NOTE — Addendum Note (Signed)
Addended by: Althea Grimmer B on: 05/10/2023 09:23 AM   Modules accepted: Orders

## 2023-05-12 NOTE — Telephone Encounter (Signed)
Pls f/u with pt again tomorrow. Thx.

## 2023-05-13 NOTE — Telephone Encounter (Signed)
I have tried multiple times calling and call gets taken straight to voice mail all times.

## 2023-05-13 NOTE — Telephone Encounter (Signed)
Called twice and both times taken straight to voice mail.

## 2023-06-01 ENCOUNTER — Other Ambulatory Visit: Payer: Self-pay | Admitting: Obstetrics and Gynecology

## 2023-06-01 DIAGNOSIS — F419 Anxiety disorder, unspecified: Secondary | ICD-10-CM

## 2023-08-02 ENCOUNTER — Other Ambulatory Visit: Payer: Self-pay

## 2023-08-02 DIAGNOSIS — F419 Anxiety disorder, unspecified: Secondary | ICD-10-CM

## 2023-08-04 ENCOUNTER — Other Ambulatory Visit: Payer: Self-pay | Admitting: Obstetrics and Gynecology

## 2023-08-04 ENCOUNTER — Telehealth: Payer: Self-pay

## 2023-08-04 DIAGNOSIS — F419 Anxiety disorder, unspecified: Secondary | ICD-10-CM

## 2023-08-04 MED ORDER — VENLAFAXINE HCL ER 150 MG PO CP24: 150.0000 mg | ORAL_CAPSULE | Freq: Every day | ORAL | 0 refills | Status: DC

## 2023-08-04 MED ORDER — LORAZEPAM 0.5 MG PO TABS: 0.5000 mg | ORAL_TABLET | Freq: Two times a day (BID) | ORAL | 0 refills | Status: AC | PRN

## 2023-08-04 NOTE — Telephone Encounter (Signed)
Rx RF eRxd.  

## 2023-08-04 NOTE — Progress Notes (Signed)
 Rx RF till annual 6/25

## 2023-08-04 NOTE — Telephone Encounter (Signed)
 Called pt, taken straight to operator "call cannot be completed as dialed". Mychart msg sent.

## 2023-08-04 NOTE — Telephone Encounter (Signed)
 Susan Charles called triage stating she needs her medication refilled but its been over an year for her annual. Told her to schedule and I would give this information to Alicia to send in those prescriptions. She's needing her Effexor  and her Lorazepam  sent into the [facility]

## 2023-08-06 ENCOUNTER — Other Ambulatory Visit: Payer: Self-pay | Admitting: Obstetrics and Gynecology

## 2023-08-06 DIAGNOSIS — F419 Anxiety disorder, unspecified: Secondary | ICD-10-CM

## 2023-09-01 NOTE — Progress Notes (Deleted)
 PCP: Theron Flavin, MD   No chief complaint on file.   HPI:      [name] is a 48 y.o. [id number] whose LMP was No LMP recorded., presents today for her annual examination.  Her menses are regular every 28-30 days, lasting 5 days.  Dysmenorrhea mild, improved with NSAIDs. She does not have intermenstrual bleeding.  Sex activity: not sexually active currently. No vag sx.   Last Pap: 05/11/22 Results were ASCUS/neg HPV DNA; Repeat pap due today. 07/01/21 and 06/24/20 Results were: no abnormalities /POS HPV DNA. No colpo done.  Hx of STDs: HPV  Last mammogram: 03/09/21 Results were: normal--routine follow-up in 12 months There is no FH of breast cancer. There is no FH of ovarian cancer. The patient does do self-breast exams.  Colonoscopy: never, interested in Cologuard, no rectal bleeding.  Tobacco use: The patient denies current or previous tobacco use. Alcohol use: social drinker No drug use Exercise: moderately active  She does not get adequate calcium but does get Vitamin D  in her diet. Labs with PCP  Pt with hx of anxiety, on ***   Patient Active Problem List   Diagnosis Date Noted   Metabolic syndrome 08/18/2021   Establishing care with new doctor, encounter for 06/11/2021   Urinary tract infection without hematuria 06/11/2021   Vitamin D  deficiency 06/11/2021    Past Surgical History:  Procedure Laterality Date   CESAREAN SECTION     x7   CHOLECYSTECTOMY      Family History  Problem Relation Age of Onset   Bladder Cancer Father    Breast cancer Neg Hx     Social History   Socioeconomic History   Marital status: Single    Spouse name: Not on file   Number of children: Not on file   Years of education: Not on file   Highest education level: Not on file  Occupational History   Not on file  Tobacco Use   Smoking status: Never   Smokeless tobacco: Never  Vaping Use   Vaping status: Never Used  Substance and Sexual Activity   Alcohol use:  Never   Drug use: Never   Sexual activity: Not Currently    Birth control/protection: Surgical    Comment: Vasectomy  Other Topics Concern   Not on file  Social History Narrative   Not on file   Social Drivers of Health   Financial Resource Strain: Not on file  Food Insecurity: Not on file  Transportation Needs: Not on file  Physical Activity: Not on file  Stress: Not on file  Social Connections: Not on file  Intimate Partner Violence: Not on file     Current Outpatient Medications:    albuterol  (VENTOLIN  HFA) 108 (90 Base) MCG/ACT inhaler, Inhale 1 puff into the lungs every 6 (six) hours as needed for wheezing or shortness of breath., Disp: 18 g, Rfl: 3   LORazepam  (ATIVAN ) 0.5 MG tablet, Take 1 tablet (0.5 mg total) by mouth 2 (two) times daily as needed for anxiety., Disp: 30 tablet, Rfl: 0   omeprazole  (PRILOSEC) 20 MG capsule, Take 1 capsule (20 mg total) by mouth daily., Disp: 90 capsule, Rfl: 2   ondansetron  (ZOFRAN ) 8 MG tablet, Take 1 tablet (8 mg total) by mouth every 8 (eight) hours as needed for nausea or vomiting., Disp: 20 tablet, Rfl: 1   phentermine (ADIPEX-P) 37.5 MG tablet, Take 37.5 mg by mouth daily. (Patient not taking: Reported on 01/05/2023), Disp: , Rfl:  SUMAtriptan  (IMITREX ) 25 MG tablet, TAKE 1 TABLET BY MOUTH EVERY 2 HOURS AS NEEDED MIGRAINE. MAY REPEAT IN 2 HOURS IF PERSISTS/RECURS., Disp: 10 tablet, Rfl: 1   trimethoprim  (TRIMPEX ) 100 MG tablet, Take 1 tablet (100 mg total) by mouth daily., Disp: 30 tablet, Rfl: 11   venlafaxine  XR (EFFEXOR -XR) 150 MG 24 hr capsule, Take 1 capsule (150 mg total) by mouth daily., Disp: 30 capsule, Rfl: 0     ROS:  Review of Systems  Constitutional:  Negative for fatigue, fever and unexpected weight change.  Respiratory:  Negative for cough, shortness of breath and wheezing.   Cardiovascular:  Negative for chest pain, palpitations and leg swelling.  Gastrointestinal:  Negative for blood in stool, constipation,  diarrhea, nausea and vomiting.  Endocrine: Negative for cold intolerance, heat intolerance and polyuria.  Genitourinary:  Positive for dysuria, frequency and pelvic pain. Negative for dyspareunia, flank pain, genital sores, hematuria, menstrual problem, urgency, vaginal bleeding, vaginal discharge and vaginal pain.  Musculoskeletal:  Positive for back pain. Negative for joint swelling and myalgias.  Skin:  Negative for rash.  Neurological:  Negative for dizziness, syncope, light-headedness, numbness and headaches.  Hematological:  Negative for adenopathy.  Psychiatric/Behavioral:  Negative for agitation, confusion, sleep disturbance and suicidal ideas. The patient is not nervous/anxious.    BREAST: No symptoms    Objective: There were no vitals taken for this visit.   Physical Exam Constitutional:      Appearance: She is well-developed.  Genitourinary:     Vulva normal.     Right Labia: No rash, tenderness or lesions.    Left Labia: No tenderness, lesions or rash.    No vaginal discharge, erythema or tenderness.      Right Adnexa: not tender and no mass present.    Left Adnexa: not tender and no mass present.    No cervical friability or polyp.     Uterus is not enlarged or tender.  Breasts:    Right: No mass, nipple discharge, skin change or tenderness.     Left: No mass, nipple discharge, skin change or tenderness.  Neck:     Thyroid: No thyromegaly.  Cardiovascular:     Rate and Rhythm: Normal rate and regular rhythm.     Heart sounds: Normal heart sounds. No murmur heard. Pulmonary:     Effort: Pulmonary effort is normal.     Breath sounds: Normal breath sounds.  Abdominal:     Palpations: Abdomen is soft.     Tenderness: There is no abdominal tenderness. There is no guarding or rebound.  Musculoskeletal:        General: Normal range of motion.     Cervical back: Normal range of motion.  Lymphadenopathy:     Cervical: No cervical adenopathy.  Neurological:      General: No focal deficit present.     Mental Status: She is alert and oriented to person, place, and time.     Cranial Nerves: No cranial nerve deficit.  Skin:    General: Skin is warm and dry.  Psychiatric:        Mood and Affect: Mood normal.        Behavior: Behavior normal.        Thought Content: Thought content normal.        Judgment: Judgment normal.  Vitals reviewed.     Results: No results found for this or any previous visit (from the past 24 hours).   Assessment/Plan:  Encounter for annual routine gynecological  examination  Cervical cancer screening - Plan: Cytology - PAP  Screening for HPV (human papillomavirus) - Plan: Cytology - PAP  Cervical high risk HPV (human papillomavirus) test positive; repeat pap today. If abn, will do colpo.  Encounter for screening mammogram for malignant neoplasm of breast - Plan: MM 3D SCREEN BREAST BILATERAL; pt to schedule mammo  Screening for colon cancer - Plan: Cologuard; colonoscopy/cologuard discussed. Pt elects cologuard. Ref sent. Will f/u with results.  UTI symptoms - Plan: sulfamethoxazole -trimethoprim  (BACTRIM  DS) 800-160 MG tablet, fluconazole  (DIFLUCAN ) 150 MG tablet, POCT Urinalysis Dipstick, Urine Culture; pos sx and UA, Rx bactrim  (and diflucan  prn yeast vag with abx use). Check C&S. If neg, will refer to urology. Question IC if neg.    No orders of the defined types were placed in this encounter.           GYN counsel breast self exam, mammography screening, adequate intake of calcium and vitamin D , diet and exercise    F/U  No follow-ups on file.  Theopolis Sloop B. Kathleene Bergemann, PA-C 09/01/2023 3:16 PM

## 2023-09-05 ENCOUNTER — Ambulatory Visit: Admitting: Obstetrics and Gynecology

## 2023-09-05 DIAGNOSIS — R87610 Atypical squamous cells of undetermined significance on cytologic smear of cervix (ASC-US): Secondary | ICD-10-CM

## 2023-09-05 DIAGNOSIS — Z1231 Encounter for screening mammogram for malignant neoplasm of breast: Secondary | ICD-10-CM

## 2023-09-05 DIAGNOSIS — Z124 Encounter for screening for malignant neoplasm of cervix: Secondary | ICD-10-CM

## 2023-09-05 DIAGNOSIS — Z1211 Encounter for screening for malignant neoplasm of colon: Secondary | ICD-10-CM

## 2023-09-05 DIAGNOSIS — Z01419 Encounter for gynecological examination (general) (routine) without abnormal findings: Secondary | ICD-10-CM

## 2023-09-05 DIAGNOSIS — F419 Anxiety disorder, unspecified: Secondary | ICD-10-CM

## 2023-09-05 DIAGNOSIS — Z1151 Encounter for screening for human papillomavirus (HPV): Secondary | ICD-10-CM

## 2023-09-07 ENCOUNTER — Encounter: Payer: Self-pay | Admitting: Obstetrics and Gynecology

## 2023-10-17 ENCOUNTER — Other Ambulatory Visit: Payer: Self-pay

## 2023-10-17 DIAGNOSIS — F419 Anxiety disorder, unspecified: Secondary | ICD-10-CM

## 2023-10-17 MED ORDER — VENLAFAXINE HCL ER 150 MG PO CP24: 150.0000 mg | ORAL_CAPSULE | Freq: Every day | ORAL | 0 refills | Status: AC

## 2023-10-17 NOTE — Telephone Encounter (Signed)
 Pt LVM asking for refill of anxiety medication until her annual on 8/7. Short term refill sent in.

## 2023-11-02 NOTE — Progress Notes (Deleted)
 PCP: Britta King, MD   No chief complaint on file.   HPI:      [name] is a 48 y.o. [id number] whose LMP was No LMP recorded., presents today for her annual examination.  Her menses are regular every 28-30 days, lasting 5 days.  Dysmenorrhea mild, improved with NSAIDs. She does not have intermenstrual bleeding.  Sex activity: not sexually active currently. No vag sx.   Last Pap: 05/11/22 Results were ASCUS/neg HPV DNA; 07/01/21 and 06/24/20 Results were: no abnormalities /POS HPV DNA. No colpo done. Repeat pap due today. Hx of STDs: HPV  Last mammogram: 03/09/21 Results were: normal--routine follow-up in 12 months There is no FH of breast cancer. There is no FH of ovarian cancer. The patient does do self-breast exams.  Colonoscopy: never, interested in Cologuard, no rectal bleeding.  Tobacco use: The patient denies current or previous tobacco use. Alcohol use: social drinker No drug use Exercise: moderately active  She does not get adequate calcium but does get Vitamin D  in her diet. No recent labs    Patient Active Problem List   Diagnosis Date Noted   Metabolic syndrome 08/18/2021   Establishing care with new doctor, encounter for 06/11/2021   Urinary tract infection without hematuria 06/11/2021   Vitamin D  deficiency 06/11/2021    Past Surgical History:  Procedure Laterality Date   CESAREAN SECTION     x7   CHOLECYSTECTOMY      Family History  Problem Relation Age of Onset   Bladder Cancer Father    Breast cancer Neg Hx     Social History   Socioeconomic History   Marital status: Single    Spouse name: Not on file   Number of children: Not on file   Years of education: Not on file   Highest education level: Not on file  Occupational History   Not on file  Tobacco Use   Smoking status: Never   Smokeless tobacco: Never  Vaping Use   Vaping status: Never Used  Substance and Sexual Activity   Alcohol use: Never   Drug use: Never   Sexual  activity: Not Currently    Birth control/protection: Surgical    Comment: Vasectomy  Other Topics Concern   Not on file  Social History Narrative   Not on file   Social Drivers of Health   Financial Resource Strain: Not on file  Food Insecurity: Not on file  Transportation Needs: Not on file  Physical Activity: Not on file  Stress: Not on file  Social Connections: Not on file  Intimate Partner Violence: Not on file     Current Outpatient Medications:    albuterol  (VENTOLIN  HFA) 108 (90 Base) MCG/ACT inhaler, Inhale 1 puff into the lungs every 6 (six) hours as needed for wheezing or shortness of breath., Disp: 18 g, Rfl: 3   LORazepam  (ATIVAN ) 0.5 MG tablet, Take 1 tablet (0.5 mg total) by mouth 2 (two) times daily as needed for anxiety., Disp: 30 tablet, Rfl: 0   omeprazole  (PRILOSEC) 20 MG capsule, Take 1 capsule (20 mg total) by mouth daily., Disp: 90 capsule, Rfl: 2   ondansetron  (ZOFRAN ) 8 MG tablet, Take 1 tablet (8 mg total) by mouth every 8 (eight) hours as needed for nausea or vomiting., Disp: 20 tablet, Rfl: 1   phentermine (ADIPEX-P) 37.5 MG tablet, Take 37.5 mg by mouth daily. (Patient not taking: Reported on 01/05/2023), Disp: , Rfl:    SUMAtriptan  (IMITREX ) 25 MG tablet, TAKE  1 TABLET BY MOUTH EVERY 2 HOURS AS NEEDED MIGRAINE. MAY REPEAT IN 2 HOURS IF PERSISTS/RECURS., Disp: 10 tablet, Rfl: 1   trimethoprim  (TRIMPEX ) 100 MG tablet, Take 1 tablet (100 mg total) by mouth daily., Disp: 30 tablet, Rfl: 11   venlafaxine  XR (EFFEXOR -XR) 150 MG 24 hr capsule, Take 1 capsule (150 mg total) by mouth daily., Disp: 30 capsule, Rfl: 0     ROS:  Review of Systems  Constitutional:  Negative for fatigue, fever and unexpected weight change.  Respiratory:  Negative for cough, shortness of breath and wheezing.   Cardiovascular:  Negative for chest pain, palpitations and leg swelling.  Gastrointestinal:  Negative for blood in stool, constipation, diarrhea, nausea and vomiting.   Endocrine: Negative for cold intolerance, heat intolerance and polyuria.  Genitourinary:  Positive for dysuria, frequency and pelvic pain. Negative for dyspareunia, flank pain, genital sores, hematuria, menstrual problem, urgency, vaginal bleeding, vaginal discharge and vaginal pain.  Musculoskeletal:  Positive for back pain. Negative for joint swelling and myalgias.  Skin:  Negative for rash.  Neurological:  Negative for dizziness, syncope, light-headedness, numbness and headaches.  Hematological:  Negative for adenopathy.  Psychiatric/Behavioral:  Negative for agitation, confusion, sleep disturbance and suicidal ideas. The patient is not nervous/anxious.    BREAST: No symptoms    Objective: There were no vitals taken for this visit.   Physical Exam Constitutional:      Appearance: She is well-developed.  Genitourinary:     Vulva normal.     Right Labia: No rash, tenderness or lesions.    Left Labia: No tenderness, lesions or rash.    No vaginal discharge, erythema or tenderness.      Right Adnexa: not tender and no mass present.    Left Adnexa: not tender and no mass present.    No cervical friability or polyp.     Uterus is not enlarged or tender.  Breasts:    Right: No mass, nipple discharge, skin change or tenderness.     Left: No mass, nipple discharge, skin change or tenderness.  Neck:     Thyroid: No thyromegaly.  Cardiovascular:     Rate and Rhythm: Normal rate and regular rhythm.     Heart sounds: Normal heart sounds. No murmur heard. Pulmonary:     Effort: Pulmonary effort is normal.     Breath sounds: Normal breath sounds.  Abdominal:     Palpations: Abdomen is soft.     Tenderness: There is no abdominal tenderness. There is no guarding or rebound.  Musculoskeletal:        General: Normal range of motion.     Cervical back: Normal range of motion.  Lymphadenopathy:     Cervical: No cervical adenopathy.  Neurological:     General: No focal deficit  present.     Mental Status: She is alert and oriented to person, place, and time.     Cranial Nerves: No cranial nerve deficit.  Skin:    General: Skin is warm and dry.  Psychiatric:        Mood and Affect: Mood normal.        Behavior: Behavior normal.        Thought Content: Thought content normal.        Judgment: Judgment normal.  Vitals reviewed.     Results: No results found for this or any previous visit (from the past 24 hours).   Assessment/Plan:  Encounter for annual routine gynecological examination  Cervical cancer screening -  Plan: Cytology - PAP  Screening for HPV (human papillomavirus) - Plan: Cytology - PAP  Cervical high risk HPV (human papillomavirus) test positive; repeat pap today. If abn, will do colpo.  Encounter for screening mammogram for malignant neoplasm of breast - Plan: MM 3D SCREEN BREAST BILATERAL; pt to schedule mammo  Screening for colon cancer - Plan: Cologuard; colonoscopy/cologuard discussed. Pt elects cologuard. Ref sent. Will f/u with results.  UTI symptoms - Plan: sulfamethoxazole -trimethoprim  (BACTRIM  DS) 800-160 MG tablet, fluconazole  (DIFLUCAN ) 150 MG tablet, POCT Urinalysis Dipstick, Urine Culture; pos sx and UA, Rx bactrim  (and diflucan  prn yeast vag with abx use). Check C&S. If neg, will refer to urology. Question IC if neg.    No orders of the defined types were placed in this encounter.           GYN counsel breast self exam, mammography screening, adequate intake of calcium and vitamin D , diet and exercise    F/U  No follow-ups on file.  Kamarius Buckbee B. Ora Mcnatt, PA-C 11/02/2023 9:27 AM

## 2023-11-03 ENCOUNTER — Ambulatory Visit: Admitting: Obstetrics and Gynecology

## 2023-11-03 DIAGNOSIS — Z1231 Encounter for screening mammogram for malignant neoplasm of breast: Secondary | ICD-10-CM

## 2023-11-03 DIAGNOSIS — Z0000 Encounter for general adult medical examination without abnormal findings: Secondary | ICD-10-CM

## 2023-11-03 DIAGNOSIS — R87610 Atypical squamous cells of undetermined significance on cytologic smear of cervix (ASC-US): Secondary | ICD-10-CM

## 2023-11-03 DIAGNOSIS — Z124 Encounter for screening for malignant neoplasm of cervix: Secondary | ICD-10-CM

## 2023-11-03 DIAGNOSIS — Z1151 Encounter for screening for human papillomavirus (HPV): Secondary | ICD-10-CM

## 2023-11-03 DIAGNOSIS — Z01419 Encounter for gynecological examination (general) (routine) without abnormal findings: Secondary | ICD-10-CM

## 2023-11-03 DIAGNOSIS — Z1211 Encounter for screening for malignant neoplasm of colon: Secondary | ICD-10-CM

## 2024-01-02 NOTE — Progress Notes (Deleted)
 PCP: Britta King, MD   No chief complaint on file.   HPI:      [name] is a 48 y.o. [phone] whose LMP was No LMP recorded., presents today for her annual examination.  Her menses are regular every 28-30 days, lasting 5 days.  Dysmenorrhea mild, improved with NSAIDs. She does not have intermenstrual bleeding.  Sex activity: not sexually active currently. No vag sx.   Last Pap: 05/11/22 Results were NILM/NEG HPV DNA; repeat due in 1 yr 07/01/21 and 06/24/20 Results were: no abnormalities /POS HPV DNA. No colpo done.  Hx of STDs: HPV  Last mammogram: 03/09/21 Results were: normal--routine follow-up in 12 months There is no FH of breast cancer. There is no FH of ovarian cancer. The patient does do self-breast exams.  Colonoscopy: never, interested in Cologuard, no rectal bleeding.  Tobacco use: The patient denies current or previous tobacco use. Alcohol use: social drinker No drug use Exercise: moderately active  She does not get adequate calcium but does get Vitamin D  in her diet. Labs with PCP  Pt with UTI sx of frequency, dysuria, LBP, pelvic pain for past month, no hematuria. Hx of UTI sx in past with neg C&S. Had considered urology ref. Drinking lots of water, 1-2 caffeinated drinks daily. Not currently sexually active. Macrobid  doesn't usually help sx.   Hx of anxiety; on effexor  150 mg and occas ativan   Patient Active Problem List   Diagnosis Date Noted   Metabolic syndrome 08/18/2021   Establishing care with new doctor, encounter for 06/11/2021   Urinary tract infection without hematuria 06/11/2021   Vitamin D  deficiency 06/11/2021    Past Surgical History:  Procedure Laterality Date   CESAREAN SECTION     x7   CHOLECYSTECTOMY      Family History  Problem Relation Age of Onset   Bladder Cancer Father    Breast cancer Neg Hx     Social History   Socioeconomic History   Marital status: Single    Spouse name: Not on file   Number of children:  Not on file   Years of education: Not on file   Highest education level: Not on file  Occupational History   Not on file  Tobacco Use   Smoking status: Never   Smokeless tobacco: Never  Vaping Use   Vaping status: Never Used  Substance and Sexual Activity   Alcohol use: Never   Drug use: Never   Sexual activity: Not Currently    Birth control/protection: Surgical    Comment: Vasectomy  Other Topics Concern   Not on file  Social History Narrative   Not on file   Social Drivers of Health   Financial Resource Strain: Not on file  Food Insecurity: Not on file  Transportation Needs: Not on file  Physical Activity: Not on file  Stress: Not on file  Social Connections: Not on file  Intimate Partner Violence: Not on file     Current Outpatient Medications:    albuterol  (VENTOLIN  HFA) 108 (90 Base) MCG/ACT inhaler, Inhale 1 puff into the lungs every 6 (six) hours as needed for wheezing or shortness of breath., Disp: 18 g, Rfl: 3   LORazepam  (ATIVAN ) 0.5 MG tablet, Take 1 tablet (0.5 mg total) by mouth 2 (two) times daily as needed for anxiety., Disp: 30 tablet, Rfl: 0   omeprazole  (PRILOSEC) 20 MG capsule, Take 1 capsule (20 mg total) by mouth daily., Disp: 90 capsule, Rfl: 2   ondansetron  (  ZOFRAN ) 8 MG tablet, Take 1 tablet (8 mg total) by mouth every 8 (eight) hours as needed for nausea or vomiting., Disp: 20 tablet, Rfl: 1   phentermine (ADIPEX-P) 37.5 MG tablet, Take 37.5 mg by mouth daily. (Patient not taking: Reported on 01/05/2023), Disp: , Rfl:    SUMAtriptan  (IMITREX ) 25 MG tablet, TAKE 1 TABLET BY MOUTH EVERY 2 HOURS AS NEEDED MIGRAINE. MAY REPEAT IN 2 HOURS IF PERSISTS/RECURS., Disp: 10 tablet, Rfl: 1   trimethoprim  (TRIMPEX ) 100 MG tablet, Take 1 tablet (100 mg total) by mouth daily., Disp: 30 tablet, Rfl: 11   venlafaxine  XR (EFFEXOR -XR) 150 MG 24 hr capsule, Take 1 capsule (150 mg total) by mouth daily., Disp: 30 capsule, Rfl: 0     ROS:  Review of Systems   Constitutional:  Negative for fatigue, fever and unexpected weight change.  Respiratory:  Negative for cough, shortness of breath and wheezing.   Cardiovascular:  Negative for chest pain, palpitations and leg swelling.  Gastrointestinal:  Negative for blood in stool, constipation, diarrhea, nausea and vomiting.  Endocrine: Negative for cold intolerance, heat intolerance and polyuria.  Genitourinary:  Positive for dysuria, frequency and pelvic pain. Negative for dyspareunia, flank pain, genital sores, hematuria, menstrual problem, urgency, vaginal bleeding, vaginal discharge and vaginal pain.  Musculoskeletal:  Positive for back pain. Negative for joint swelling and myalgias.  Skin:  Negative for rash.  Neurological:  Negative for dizziness, syncope, light-headedness, numbness and headaches.  Hematological:  Negative for adenopathy.  Psychiatric/Behavioral:  Negative for agitation, confusion, sleep disturbance and suicidal ideas. The patient is not nervous/anxious.    BREAST: No symptoms    Objective: There were no vitals taken for this visit.   Physical Exam Constitutional:      Appearance: She is well-developed.  Genitourinary:     Vulva normal.     Right Labia: No rash, tenderness or lesions.    Left Labia: No tenderness, lesions or rash.    No vaginal discharge, erythema or tenderness.      Right Adnexa: not tender and no mass present.    Left Adnexa: not tender and no mass present.    No cervical friability or polyp.     Uterus is not enlarged or tender.  Breasts:    Right: No mass, nipple discharge, skin change or tenderness.     Left: No mass, nipple discharge, skin change or tenderness.  Neck:     Thyroid: No thyromegaly.  Cardiovascular:     Rate and Rhythm: Normal rate and regular rhythm.     Heart sounds: Normal heart sounds. No murmur heard. Pulmonary:     Effort: Pulmonary effort is normal.     Breath sounds: Normal breath sounds.  Abdominal:     Palpations:  Abdomen is soft.     Tenderness: There is no abdominal tenderness. There is no guarding or rebound.  Musculoskeletal:        General: Normal range of motion.     Cervical back: Normal range of motion.  Lymphadenopathy:     Cervical: No cervical adenopathy.  Neurological:     General: No focal deficit present.     Mental Status: She is alert and oriented to person, place, and time.     Cranial Nerves: No cranial nerve deficit.  Skin:    General: Skin is warm and dry.  Psychiatric:        Mood and Affect: Mood normal.        Behavior: Behavior normal.  Thought Content: Thought content normal.        Judgment: Judgment normal.  Vitals reviewed.     Results: No results found for this or any previous visit (from the past 24 hours).   Assessment/Plan:  Encounter for annual routine gynecological examination  Cervical cancer screening - Plan: Cytology - PAP  Screening for HPV (human papillomavirus) - Plan: Cytology - PAP  Cervical high risk HPV (human papillomavirus) test positive; repeat pap today. If abn, will do colpo.  Encounter for screening mammogram for malignant neoplasm of breast - Plan: MM 3D SCREEN BREAST BILATERAL; pt to schedule mammo  Screening for colon cancer - Plan: Cologuard; colonoscopy/cologuard discussed. Pt elects cologuard. Ref sent. Will f/u with results.  UTI symptoms - Plan: sulfamethoxazole -trimethoprim  (BACTRIM  DS) 800-160 MG tablet, fluconazole  (DIFLUCAN ) 150 MG tablet, POCT Urinalysis Dipstick, Urine Culture; pos sx and UA, Rx bactrim  (and diflucan  prn yeast vag with abx use). Check C&S. If neg, will refer to urology. Question IC if neg.    No orders of the defined types were placed in this encounter.           GYN counsel breast self exam, mammography screening, adequate intake of calcium and vitamin D , diet and exercise    F/U  No follow-ups on file.  Susan Charles B. Lessa Huge, PA-C 01/02/2024 12:59 PM

## 2024-01-03 ENCOUNTER — Ambulatory Visit: Admitting: Obstetrics and Gynecology

## 2024-01-23 NOTE — Progress Notes (Deleted)
 PCP: Britta King, MD   No chief complaint on file.   HPI:      [name] is a 47 y.o. [phone] whose LMP was No LMP recorded., presents today for her annual examination.  Her menses are regular every 28-30 days, lasting 5 days.  Dysmenorrhea mild, improved with NSAIDs. She does not have intermenstrual bleeding.  Sex activity: not sexually active currently. No vag sx.   Last Pap: 05/11/22 Results were NILM/NEG HPV DNA; repeat due in 1 yr 07/01/21 and 06/24/20 Results were: no abnormalities /POS HPV DNA. No colpo done.  Hx of STDs: HPV  Last mammogram: 03/09/21 Results were: normal--routine follow-up in 12 months There is no FH of breast cancer. There is no FH of ovarian cancer. The patient does do self-breast exams.  Colonoscopy: never, interested in Cologuard, no rectal bleeding.  Tobacco use: The patient denies current or previous tobacco use. Alcohol use: social drinker No drug use Exercise: moderately active  She does not get adequate calcium but does get Vitamin D  in her diet. Labs with PCP  Pt with UTI sx of frequency, dysuria, LBP, pelvic pain for past month, no hematuria. Hx of UTI sx in past with neg C&S. Had considered urology ref. Drinking lots of water, 1-2 caffeinated drinks daily. Not currently sexually active. Macrobid  doesn't usually help sx.   Hx of anxiety; on effexor  150 mg and occas ativan   Patient Active Problem List   Diagnosis Date Noted   Metabolic syndrome 08/18/2021   Establishing care with new doctor, encounter for 06/11/2021   Urinary tract infection without hematuria 06/11/2021   Vitamin D  deficiency 06/11/2021    Past Surgical History:  Procedure Laterality Date   CESAREAN SECTION     x7   CHOLECYSTECTOMY      Family History  Problem Relation Age of Onset   Bladder Cancer Father    Breast cancer Neg Hx     Social History   Socioeconomic History   Marital status: Single    Spouse name: Not on file   Number of children:  Not on file   Years of education: Not on file   Highest education level: Not on file  Occupational History   Not on file  Tobacco Use   Smoking status: Never   Smokeless tobacco: Never  Vaping Use   Vaping status: Never Used  Substance and Sexual Activity   Alcohol use: Never   Drug use: Never   Sexual activity: Not Currently    Birth control/protection: Surgical    Comment: Vasectomy  Other Topics Concern   Not on file  Social History Narrative   Not on file   Social Drivers of Health   Financial Resource Strain: Not on file  Food Insecurity: Not on file  Transportation Needs: Not on file  Physical Activity: Not on file  Stress: Not on file  Social Connections: Not on file  Intimate Partner Violence: Not on file     Current Outpatient Medications:    albuterol  (VENTOLIN  HFA) 108 (90 Base) MCG/ACT inhaler, Inhale 1 puff into the lungs every 6 (six) hours as needed for wheezing or shortness of breath., Disp: 18 g, Rfl: 3   LORazepam  (ATIVAN ) 0.5 MG tablet, Take 1 tablet (0.5 mg total) by mouth 2 (two) times daily as needed for anxiety., Disp: 30 tablet, Rfl: 0   omeprazole  (PRILOSEC) 20 MG capsule, Take 1 capsule (20 mg total) by mouth daily., Disp: 90 capsule, Rfl: 2   ondansetron  (  ZOFRAN ) 8 MG tablet, Take 1 tablet (8 mg total) by mouth every 8 (eight) hours as needed for nausea or vomiting., Disp: 20 tablet, Rfl: 1   phentermine (ADIPEX-P) 37.5 MG tablet, Take 37.5 mg by mouth daily. (Patient not taking: Reported on 01/05/2023), Disp: , Rfl:    SUMAtriptan  (IMITREX ) 25 MG tablet, TAKE 1 TABLET BY MOUTH EVERY 2 HOURS AS NEEDED MIGRAINE. MAY REPEAT IN 2 HOURS IF PERSISTS/RECURS., Disp: 10 tablet, Rfl: 1   trimethoprim  (TRIMPEX ) 100 MG tablet, Take 1 tablet (100 mg total) by mouth daily., Disp: 30 tablet, Rfl: 11   venlafaxine  XR (EFFEXOR -XR) 150 MG 24 hr capsule, Take 1 capsule (150 mg total) by mouth daily., Disp: 30 capsule, Rfl: 0     ROS:  Review of Systems   Constitutional:  Negative for fatigue, fever and unexpected weight change.  Respiratory:  Negative for cough, shortness of breath and wheezing.   Cardiovascular:  Negative for chest pain, palpitations and leg swelling.  Gastrointestinal:  Negative for blood in stool, constipation, diarrhea, nausea and vomiting.  Endocrine: Negative for cold intolerance, heat intolerance and polyuria.  Genitourinary:  Positive for dysuria, frequency and pelvic pain. Negative for dyspareunia, flank pain, genital sores, hematuria, menstrual problem, urgency, vaginal bleeding, vaginal discharge and vaginal pain.  Musculoskeletal:  Positive for back pain. Negative for joint swelling and myalgias.  Skin:  Negative for rash.  Neurological:  Negative for dizziness, syncope, light-headedness, numbness and headaches.  Hematological:  Negative for adenopathy.  Psychiatric/Behavioral:  Negative for agitation, confusion, sleep disturbance and suicidal ideas. The patient is not nervous/anxious.    BREAST: No symptoms    Objective: There were no vitals taken for this visit.   Physical Exam Constitutional:      Appearance: She is well-developed.  Genitourinary:     Vulva normal.     Right Labia: No rash, tenderness or lesions.    Left Labia: No tenderness, lesions or rash.    No vaginal discharge, erythema or tenderness.      Right Adnexa: not tender and no mass present.    Left Adnexa: not tender and no mass present.    No cervical friability or polyp.     Uterus is not enlarged or tender.  Breasts:    Right: No mass, nipple discharge, skin change or tenderness.     Left: No mass, nipple discharge, skin change or tenderness.  Neck:     Thyroid: No thyromegaly.  Cardiovascular:     Rate and Rhythm: Normal rate and regular rhythm.     Heart sounds: Normal heart sounds. No murmur heard. Pulmonary:     Effort: Pulmonary effort is normal.     Breath sounds: Normal breath sounds.  Abdominal:     Palpations:  Abdomen is soft.     Tenderness: There is no abdominal tenderness. There is no guarding or rebound.  Musculoskeletal:        General: Normal range of motion.     Cervical back: Normal range of motion.  Lymphadenopathy:     Cervical: No cervical adenopathy.  Neurological:     General: No focal deficit present.     Mental Status: She is alert and oriented to person, place, and time.     Cranial Nerves: No cranial nerve deficit.  Skin:    General: Skin is warm and dry.  Psychiatric:        Mood and Affect: Mood normal.        Behavior: Behavior normal.  Thought Content: Thought content normal.        Judgment: Judgment normal.  Vitals reviewed.     Results: No results found for this or any previous visit (from the past 24 hours).   Assessment/Plan:  Encounter for annual routine gynecological examination  Cervical cancer screening - Plan: Cytology - PAP  Screening for HPV (human papillomavirus) - Plan: Cytology - PAP  Cervical high risk HPV (human papillomavirus) test positive; repeat pap today. If abn, will do colpo.  Encounter for screening mammogram for malignant neoplasm of breast - Plan: MM 3D SCREEN BREAST BILATERAL; pt to schedule mammo  Screening for colon cancer - Plan: Cologuard; colonoscopy/cologuard discussed. Pt elects cologuard. Ref sent. Will f/u with results.  UTI symptoms - Plan: sulfamethoxazole -trimethoprim  (BACTRIM  DS) 800-160 MG tablet, fluconazole  (DIFLUCAN ) 150 MG tablet, POCT Urinalysis Dipstick, Urine Culture; pos sx and UA, Rx bactrim  (and diflucan  prn yeast vag with abx use). Check C&S. If neg, will refer to urology. Question IC if neg.    No orders of the defined types were placed in this encounter.           GYN counsel breast self exam, mammography screening, adequate intake of calcium and vitamin D , diet and exercise    F/U  No follow-ups on file.  Kamaal Cast B. Katya Rolston, PA-C 01/23/2024 1:18 PM

## 2024-01-24 ENCOUNTER — Ambulatory Visit: Admitting: Obstetrics and Gynecology

## 2024-01-24 DIAGNOSIS — F419 Anxiety disorder, unspecified: Secondary | ICD-10-CM

## 2024-01-24 DIAGNOSIS — Z1231 Encounter for screening mammogram for malignant neoplasm of breast: Secondary | ICD-10-CM

## 2024-01-24 DIAGNOSIS — Z1211 Encounter for screening for malignant neoplasm of colon: Secondary | ICD-10-CM

## 2024-01-24 DIAGNOSIS — Z1151 Encounter for screening for human papillomavirus (HPV): Secondary | ICD-10-CM

## 2024-01-24 DIAGNOSIS — Z01419 Encounter for gynecological examination (general) (routine) without abnormal findings: Secondary | ICD-10-CM

## 2024-01-24 DIAGNOSIS — R87810 Cervical high risk human papillomavirus (HPV) DNA test positive: Secondary | ICD-10-CM

## 2024-01-24 DIAGNOSIS — Z124 Encounter for screening for malignant neoplasm of cervix: Secondary | ICD-10-CM

## 2024-02-14 NOTE — Progress Notes (Deleted)
 PCP: Britta King, MD   No chief complaint on file.   HPI:      [name] is a 48 y.o. [phone] whose LMP was No LMP recorded., presents today for her annual examination.  Her menses are regular every 28-30 days, lasting 5 days.  Dysmenorrhea mild, improved with NSAIDs. She does not have intermenstrual bleeding.  Sex activity: not sexually active currently. No vag sx.   Last Pap: 05/11/22 Results were NILM/NEG HPV DNA; repeat due in 1 yr 07/01/21 and 06/24/20 Results were: no abnormalities /POS HPV DNA. No colpo done.  Hx of STDs: HPV  Last mammogram: 03/09/21 Results were: normal--routine follow-up in 12 months There is no FH of breast cancer. There is no FH of ovarian cancer. The patient does do self-breast exams.  Colonoscopy: never, interested in Cologuard, no rectal bleeding.  Tobacco use: The patient denies current or previous tobacco use. Alcohol use: social drinker No drug use Exercise: moderately active  She does not get adequate calcium but does get Vitamin D  in her diet. Labs with PCP  Pt with UTI sx of frequency, dysuria, LBP, pelvic pain for past month, no hematuria. Hx of UTI sx in past with neg C&S. Had considered urology ref. Drinking lots of water, 1-2 caffeinated drinks daily. Not currently sexually active. Macrobid  doesn't usually help sx.   Hx of anxiety; on effexor  150 mg and occas ativan   Patient Active Problem List   Diagnosis Date Noted   Metabolic syndrome 08/18/2021   Establishing care with new doctor, encounter for 06/11/2021   Urinary tract infection without hematuria 06/11/2021   Vitamin D  deficiency 06/11/2021    Past Surgical History:  Procedure Laterality Date   CESAREAN SECTION     x7   CHOLECYSTECTOMY      Family History  Problem Relation Age of Onset   Bladder Cancer Father    Breast cancer Neg Hx     Social History   Socioeconomic History   Marital status: Single    Spouse name: Not on file   Number of children:  Not on file   Years of education: Not on file   Highest education level: Not on file  Occupational History   Not on file  Tobacco Use   Smoking status: Never   Smokeless tobacco: Never  Vaping Use   Vaping status: Never Used  Substance and Sexual Activity   Alcohol use: Never   Drug use: Never   Sexual activity: Not Currently    Birth control/protection: Surgical    Comment: Vasectomy  Other Topics Concern   Not on file  Social History Narrative   Not on file   Social Drivers of Health   Financial Resource Strain: Not on file  Food Insecurity: Not on file  Transportation Needs: Not on file  Physical Activity: Not on file  Stress: Not on file  Social Connections: Not on file  Intimate Partner Violence: Not on file     Current Outpatient Medications:    albuterol  (VENTOLIN  HFA) 108 (90 Base) MCG/ACT inhaler, Inhale 1 puff into the lungs every 6 (six) hours as needed for wheezing or shortness of breath., Disp: 18 g, Rfl: 3   LORazepam  (ATIVAN ) 0.5 MG tablet, Take 1 tablet (0.5 mg total) by mouth 2 (two) times daily as needed for anxiety., Disp: 30 tablet, Rfl: 0   omeprazole  (PRILOSEC) 20 MG capsule, Take 1 capsule (20 mg total) by mouth daily., Disp: 90 capsule, Rfl: 2   ondansetron  (  ZOFRAN ) 8 MG tablet, Take 1 tablet (8 mg total) by mouth every 8 (eight) hours as needed for nausea or vomiting., Disp: 20 tablet, Rfl: 1   phentermine (ADIPEX-P) 37.5 MG tablet, Take 37.5 mg by mouth daily. (Patient not taking: Reported on 01/05/2023), Disp: , Rfl:    SUMAtriptan  (IMITREX ) 25 MG tablet, TAKE 1 TABLET BY MOUTH EVERY 2 HOURS AS NEEDED MIGRAINE. MAY REPEAT IN 2 HOURS IF PERSISTS/RECURS., Disp: 10 tablet, Rfl: 1   trimethoprim  (TRIMPEX ) 100 MG tablet, Take 1 tablet (100 mg total) by mouth daily., Disp: 30 tablet, Rfl: 11   venlafaxine  XR (EFFEXOR -XR) 150 MG 24 hr capsule, Take 1 capsule (150 mg total) by mouth daily., Disp: 30 capsule, Rfl: 0     ROS:  Review of Systems   Constitutional:  Negative for fatigue, fever and unexpected weight change.  Respiratory:  Negative for cough, shortness of breath and wheezing.   Cardiovascular:  Negative for chest pain, palpitations and leg swelling.  Gastrointestinal:  Negative for blood in stool, constipation, diarrhea, nausea and vomiting.  Endocrine: Negative for cold intolerance, heat intolerance and polyuria.  Genitourinary:  Positive for dysuria, frequency and pelvic pain. Negative for dyspareunia, flank pain, genital sores, hematuria, menstrual problem, urgency, vaginal bleeding, vaginal discharge and vaginal pain.  Musculoskeletal:  Positive for back pain. Negative for joint swelling and myalgias.  Skin:  Negative for rash.  Neurological:  Negative for dizziness, syncope, light-headedness, numbness and headaches.  Hematological:  Negative for adenopathy.  Psychiatric/Behavioral:  Negative for agitation, confusion, sleep disturbance and suicidal ideas. The patient is not nervous/anxious.    BREAST: No symptoms    Objective: There were no vitals taken for this visit.   Physical Exam Constitutional:      Appearance: She is well-developed.  Genitourinary:     Vulva normal.     Right Labia: No rash, tenderness or lesions.    Left Labia: No tenderness, lesions or rash.    No vaginal discharge, erythema or tenderness.      Right Adnexa: not tender and no mass present.    Left Adnexa: not tender and no mass present.    No cervical friability or polyp.     Uterus is not enlarged or tender.  Breasts:    Right: No mass, nipple discharge, skin change or tenderness.     Left: No mass, nipple discharge, skin change or tenderness.  Neck:     Thyroid: No thyromegaly.  Cardiovascular:     Rate and Rhythm: Normal rate and regular rhythm.     Heart sounds: Normal heart sounds. No murmur heard. Pulmonary:     Effort: Pulmonary effort is normal.     Breath sounds: Normal breath sounds.  Abdominal:     Palpations:  Abdomen is soft.     Tenderness: There is no abdominal tenderness. There is no guarding or rebound.  Musculoskeletal:        General: Normal range of motion.     Cervical back: Normal range of motion.  Lymphadenopathy:     Cervical: No cervical adenopathy.  Neurological:     General: No focal deficit present.     Mental Status: She is alert and oriented to person, place, and time.     Cranial Nerves: No cranial nerve deficit.  Skin:    General: Skin is warm and dry.  Psychiatric:        Mood and Affect: Mood normal.        Behavior: Behavior normal.  Thought Content: Thought content normal.        Judgment: Judgment normal.  Vitals reviewed.     Results: No results found for this or any previous visit (from the past 24 hours).   Assessment/Plan:  Encounter for annual routine gynecological examination  Cervical cancer screening - Plan: Cytology - PAP  Screening for HPV (human papillomavirus) - Plan: Cytology - PAP  Cervical high risk HPV (human papillomavirus) test positive; repeat pap today. If abn, will do colpo.  Encounter for screening mammogram for malignant neoplasm of breast - Plan: MM 3D SCREEN BREAST BILATERAL; pt to schedule mammo  Screening for colon cancer - Plan: Cologuard; colonoscopy/cologuard discussed. Pt elects cologuard. Ref sent. Will f/u with results.  UTI symptoms - Plan: sulfamethoxazole -trimethoprim  (BACTRIM  DS) 800-160 MG tablet, fluconazole  (DIFLUCAN ) 150 MG tablet, POCT Urinalysis Dipstick, Urine Culture; pos sx and UA, Rx bactrim  (and diflucan  prn yeast vag with abx use). Check C&S. If neg, will refer to urology. Question IC if neg.    No orders of the defined types were placed in this encounter.           GYN counsel breast self exam, mammography screening, adequate intake of calcium and vitamin D , diet and exercise    F/U  No follow-ups on file.  Loleta Frommelt B. Dejanique Ruehl, PA-C 02/14/2024 4:59 PM

## 2024-02-15 ENCOUNTER — Ambulatory Visit: Admitting: Obstetrics and Gynecology

## 2024-02-15 DIAGNOSIS — Z01419 Encounter for gynecological examination (general) (routine) without abnormal findings: Secondary | ICD-10-CM

## 2024-02-15 DIAGNOSIS — Z1151 Encounter for screening for human papillomavirus (HPV): Secondary | ICD-10-CM

## 2024-02-15 DIAGNOSIS — Z124 Encounter for screening for malignant neoplasm of cervix: Secondary | ICD-10-CM
